# Patient Record
Sex: Female | Born: 2000 | Race: Black or African American | Hispanic: No | Marital: Single | Smoking: Never smoker
Health system: Southern US, Community
[De-identification: ages and names within clinical notes are randomized; demographics above are authoritative.]

## PROBLEM LIST (undated history)

## (undated) DIAGNOSIS — N1330 Unspecified hydronephrosis: Secondary | ICD-10-CM

## (undated) HISTORY — PX: OTHER SURGICAL HISTORY: SHX169

---
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
## 2019-12-26 ENCOUNTER — Encounter: Payer: Self-pay | Admitting: Family Medicine

## 2019-12-26 ENCOUNTER — Ambulatory Visit: Payer: BC Managed Care – PPO | Admitting: Family Medicine

## 2019-12-26 ENCOUNTER — Other Ambulatory Visit: Payer: Self-pay

## 2019-12-26 VITALS — BP 108/62 | HR 82 | Temp 97.5°F | Resp 12 | Ht 61.0 in | Wt 99.8 lb

## 2019-12-26 DIAGNOSIS — N185 Chronic kidney disease, stage 5: Secondary | ICD-10-CM | POA: Diagnosis not present

## 2019-12-26 DIAGNOSIS — N2581 Secondary hyperparathyroidism of renal origin: Secondary | ICD-10-CM | POA: Diagnosis not present

## 2019-12-26 DIAGNOSIS — D649 Anemia, unspecified: Secondary | ICD-10-CM | POA: Diagnosis not present

## 2019-12-26 DIAGNOSIS — Z7689 Persons encountering health services in other specified circumstances: Secondary | ICD-10-CM

## 2019-12-26 DIAGNOSIS — E559 Vitamin D deficiency, unspecified: Secondary | ICD-10-CM | POA: Diagnosis not present

## 2019-12-26 DIAGNOSIS — N1330 Unspecified hydronephrosis: Secondary | ICD-10-CM

## 2019-12-26 NOTE — Progress Notes (Signed)
Name: Ashley Garrison   MRN: [id number]    DOB: 02/05/2001   Date:12/26/2019       Progress Note  Chief Complaint  Patient presents with  . Establish Care     Subjective:   Ashley Garrison is a 19 y.o. female, presents to clinic to establish care.  She states she was seen here a long time ago, she is a poor historian some the details about her diagnoses and past medical care are vague for unclear at best.  When asking about her past doctor she says she was a prior patient here, [facility] and it looks like she needed to transition from pediatric to adult primary care.  When reviewing care everywhere and [facility] visits it appears that she has several specialist that she is seeing including urology and nephrology.  She states that she is no longer seeing them but does not really explain why and she is very unclear about her medical conditions, her past labs, and any follow-up plan.  I do believe after reviewing the chart at length and discussing it with the patient at length that she needed to transition from pediatric subspecialist to normal urology and nephrology but she perhaps does not understand this. She gives a history of bladder problems and some kidney problems.  In reviewing chart it appears that she has end-stage renal failure headed for dialysis or transplant when I have asked about these things she is very unsure or unclear by the history she states that they talked about possible kidney transplant but she does not seem to be aware that she still has severe kidney disease.  She is not on any medications, and has not been seen for at least 4 months by her pediatrician or her specialist likely secondary to turning 18 last August.  She states she is on oils and supplements from some doctors in [place] that sold them to her but she does not know what she is using or the clinic or practice name nor any of the providers names.  She has history of hydronephrosis,  bladder dysfunction and saw you pediatric urology, Dr. Gari Crown at [facility].  She currently denies any flank pain, abdominal pain, decreased urine output.  She states that she drinks a lot of water and urinates about 4 times a day.  She states that she has a distended bladder and that they had discussed possibly doing a indwelling catheter to decompress the bladder but she has never done any self-catheterization or indwelling catheter before.  She reports and per [facility] there is a history of of CKD stage 4, was seeing nephrology Dr. Noberto Retort at [facility], "not seeing them any more"- she states she was seeing them about every 6 months.  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  She does not know if she has had any kidney biopsies or what her diagnosis is causing her nephropathy.  She denies any decreased urine output, rashes or pruritus, peripheral edema, weight changes, shortness of breath, chest pain, palpitations, orthopnea, near syncope, fatigue. Last visit with [facility] Sept 2020 -stated he was concerned for renal failure headed towards dialysis or transplant Ultrasound shows dense scarred echogenic kidneys with hydronephrosis and follow-up bladder with trabeculation Patient states her grandmother has a history of kidney failure and she does not know  any other pertinent medical history Hx of anemia last Hgb was 9.4, with iron deficiency, not on any iron supplements and she has not seen hematology Has regular menses, normal cycle, periods lasts 3d, no heavy bleeding  Went to pediatric [facility] before - will request records - [place] She states she came to this same clinic a long time ago   Patient Active Problem List   Diagnosis Date Noted  . 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 Hyperparathyroidism,  secondary renal ([place]) 12/27/2019  . Vitamin D deficiency disease 12/27/2019  . Anemia 12/27/2019  . Hydronephrosis 12/27/2019    History reviewed. No pertinent surgical history.  History reviewed. No pertinent family history.  Social History   Socioeconomic History  . Marital status: Single    Spouse name: Not on file  . Number of children: Not on file  . Years of education: Not on file  . Highest education level: Not on file  Occupational History    Comment: [employer]  Tobacco Use  . Smoking status: Never Smoker  . Smokeless tobacco: Never Used  Substance and Sexual Activity  . Alcohol use: No  . Drug use: Never  . Sexual activity: Not Currently  Other Topics Concern  . Not on file  Social History Narrative  . Not on file   Social Determinants of Health   Financial Resource Strain:   . Difficulty of Paying Living Expenses: Not on file  Food Insecurity:   . Worried About Charity fundraiser in the Last Year: Not on file  . Ran Out of Food in the Last Year: Not on file  Transportation Needs:   . Lack of Transportation (Medical): Not on file  . Lack of Transportation (Non-Medical): Not on file  Physical Activity:   . Days of Exercise per Week: Not on file  . Minutes of Exercise per Session: Not on file  Stress:   . Feeling of Stress : Not on file  Social Connections:   . Frequency of Communication with Friends and Family: Not on file  . Frequency of Social Gatherings with Friends and Family: Not on file  . Attends Religious Services: Not on file  . Active Member of Clubs or Organizations: Not on file  . Attends Archivist Meetings: Not on file  . Marital Status: Not on file  Intimate Partner Violence:   . Fear of Current or Ex-Partner: Not on file  . Emotionally Abused: Not on file  . Physically Abused: Not on file  . Sexually Abused: Not on file    No current outpatient medications on file.  Allergies  Allergen Reactions  . Other     fruit     Chart Review Today: Extensive chart review through care everywhere [facility] visits, labs I personally reviewed active problem list, medication list, allergies, family history, social history, health maintenance, notes from last encounter, lab results, imaging with the patient/caregiver today.   Review of Systems  Constitutional: Negative.   HENT: Negative.   Eyes: Negative.   Respiratory: Negative.   Cardiovascular: Negative.   Gastrointestinal: Negative.   Endocrine: Negative.   Genitourinary: Negative.   Musculoskeletal: Negative.   Skin: Negative.   Allergic/Immunologic: Negative.   Neurological: Negative.   Hematological: Negative.   Psychiatric/Behavioral: Negative.   All other systems reviewed and are negative.    Objective:    Vitals:   12/26/19 1515  BP: 108/62  Pulse: 82  Resp: 12  Temp: (!) 97.5 F (36.4 C)  TempSrc: Temporal  SpO2: 100%  Weight: 99 lb 12.8 oz (45.3 kg)  Height: 5\' 1"  (1.549 m)    Body mass index is 18.86 kg/m.  Physical Exam Vitals and nursing note reviewed.  Constitutional:      General: She is not in acute distress.    Appearance: Normal appearance. She is well-developed. She is not ill-appearing, toxic-appearing or diaphoretic.     Interventions: Face mask in place.     Comments: Thin well appearing female  HENT:     Head: Normocephalic and atraumatic.     Right Ear: External ear normal.     Left Ear: External ear normal.  Eyes:     General: Lids are normal. No scleral icterus.       Right eye: No discharge.        Left eye: No discharge.     Conjunctiva/sclera: Conjunctivae normal.  Neck:     Trachea: Phonation normal. No tracheal deviation.  Cardiovascular:     Rate and Rhythm: Normal rate and regular rhythm.     Pulses: Normal pulses.          Radial pulses are 2+ on the right side and 2+ on the left side.       Posterior tibial pulses are 2+ on the right side and 2+ on the left side.     Heart sounds: Normal heart  sounds. No murmur. No friction rub. No gallop.   Pulmonary:     Effort: Pulmonary effort is normal. No respiratory distress.     Breath sounds: Normal breath sounds. No stridor. No wheezing, rhonchi or rales.  Chest:     Chest wall: No tenderness.  Abdominal:     General: Bowel sounds are normal. There is no distension.     Palpations: Abdomen is soft.     Tenderness: There is no abdominal tenderness. There is no guarding or rebound.  Musculoskeletal:        General: No deformity. Normal range of motion.     Cervical back: Normal range of motion and neck supple.     Right lower leg: No edema.     Left lower leg: No edema.  Lymphadenopathy:     Cervical: No cervical adenopathy.  Skin:    General: Skin is warm and dry.     Capillary Refill: Capillary refill takes less than 2 seconds.     Coloration: Skin is not jaundiced or pale.     Findings: No rash.  Neurological:     Mental Status: She is alert and oriented to person, place, and time.     Motor: No abnormal muscle tone.     Gait: Gait normal.  Psychiatric:        Speech: Speech normal.        Behavior: Behavior normal.      PHQ2/9: Depression screen PHQ 2/9 12/26/2019  Decreased Interest 0  Down, Depressed, Hopeless 0  PHQ - 2 Score 0  Altered sleeping 0  Tired, decreased energy 0  Change in appetite 0  Feeling bad or failure about yourself  0  Trouble concentrating 0  Moving slowly or fidgety/restless 0  Suicidal thoughts 0  PHQ-9 Score 0  Difficult doing work/chores Not difficult at all    phq 9 is negative, reviewed  Fall Risk: Fall Risk  12/26/2019  Falls in the past year? 0  Number falls in past yr: 0  Injury with Fall? 0  Functional Status Survey: Is the patient deaf or have difficulty hearing?: No Does the patient have difficulty seeing, even when wearing glasses/contacts?: No Does the patient have difficulty concentrating, remembering, or making decisions?: No Does the patient have difficulty  walking or climbing stairs?: No Does the patient have difficulty dressing or bathing?: No Does the patient have difficulty doing errands alone such as visiting a doctor's office or shopping?: No   Assessment & Plan:     ICD-10-CM   1. Stage 5 chronic kidney disease not on chronic dialysis ([place])  N18.5 CMP w GFR    Uric acid    Vit D    CBC +diff and smear    Iron, TIBC and Ferritin Panel    PTH    TSH    T4, Free    Ambulatory referral to Nephrology   last GFR 9, previously saw pediatric nephrology at [facility], likely aged out, pt is poor historian, seems unaware of renal function/chronic disease - refer nephro  2. Hyperparathyroidism, secondary renal ([place])  N25.81 CMP w GFR    Uric acid    Vit D    PTH    TSH    T4, Free    Ambulatory referral to Nephrology   recheck labs, refer  3. Vitamin D deficiency disease  E55.9 Vit D    Ambulatory referral to Nephrology   recheck - will need nephro  4. Anemia, unspecified type  D64.9 CBC +diff and smear    Iron, TIBC and Ferritin Panel    Ambulatory referral to Nephrology   recheck, no concerning blood loss with likely some continued anemia or chronic disease  5. Hydronephrosis, unspecified hydronephrosis type  N13.30 Ambulatory referral to Urology   some kind of bladder dysfunction and hydronephrosis, she is urinating normally and has no abd pain or flank pain, est with urology  6. Encounter to establish care with new doctor  Z76.89    extensive chart review, requested records from past pediatrician    concern for renal failure? W/o any current management - refer and labs today  She is not oliguric currently per her report, but poor historian, unclear if she understands her dx/med conditions.  Return for 3 month - f/up on chronic conditions and specialist visits.   Delsa Grana, PA-C 12/26/19 3:31 PM

## 2019-12-27 ENCOUNTER — Ambulatory Visit: Payer: Self-pay | Admitting: Family Medicine

## 2019-12-27 DIAGNOSIS — N2581 Secondary hyperparathyroidism of renal origin: Secondary | ICD-10-CM | POA: Insufficient documentation

## 2019-12-27 DIAGNOSIS — N185 Chronic kidney disease, stage 5: Secondary | ICD-10-CM | POA: Insufficient documentation

## 2019-12-27 DIAGNOSIS — Z992 Dependence on renal dialysis: Secondary | ICD-10-CM | POA: Insufficient documentation

## 2019-12-27 DIAGNOSIS — D649 Anemia, unspecified: Secondary | ICD-10-CM | POA: Insufficient documentation

## 2019-12-27 DIAGNOSIS — E559 Vitamin D deficiency, unspecified: Secondary | ICD-10-CM | POA: Insufficient documentation

## 2019-12-27 DIAGNOSIS — N186 End stage renal disease: Secondary | ICD-10-CM | POA: Insufficient documentation

## 2019-12-27 DIAGNOSIS — N1330 Unspecified hydronephrosis: Secondary | ICD-10-CM | POA: Insufficient documentation

## 2019-12-27 HISTORY — DX: Anemia, unspecified: D64.9

## 2019-12-27 LAB — IRON,TIBC AND FERRITIN PANEL
%SAT: 23 % (calc) (ref 15–45)
Ferritin: 76 ng/mL (ref 16–154)
Iron: 59 ug/dL (ref 27–164)
TIBC: 258 mcg/dL (calc) — ABNORMAL LOW (ref 271–448)

## 2019-12-27 LAB — COMPLETE METABOLIC PANEL WITH GFR
AG Ratio: 1.1 (calc) (ref 1.0–2.5)
ALT: 5 U/L (ref 5–32)
AST: 11 U/L — ABNORMAL LOW (ref 12–32)
Albumin: 4.1 g/dL (ref 3.6–5.1)
Alkaline phosphatase (APISO): 146 U/L — ABNORMAL HIGH (ref 36–128)
BUN/Creatinine Ratio: 5 (calc) — ABNORMAL LOW (ref 6–22)
BUN: 36 mg/dL — ABNORMAL HIGH (ref 7–20)
CO2: 20 mmol/L (ref 20–32)
Calcium: 8.7 mg/dL — ABNORMAL LOW (ref 8.9–10.4)
Chloride: 108 mmol/L (ref 98–110)
Creat: 6.6 mg/dL — ABNORMAL HIGH (ref 0.50–1.00)
GFR, Est African American: 10 mL/min/{1.73_m2} — ABNORMAL LOW (ref >=60)
GFR, Est Non African American: 8 mL/min/{1.73_m2} — ABNORMAL LOW (ref >=60)
Globulin: 3.9 g/dL (calc) — ABNORMAL HIGH (ref 2.0–3.8)
Glucose, Bld: 86 mg/dL (ref 65–99)
Potassium: 4.8 mmol/L (ref 3.8–5.1)
Sodium: 137 mmol/L (ref 135–146)
Total Bilirubin: 0.4 mg/dL (ref 0.2–1.1)
Total Protein: 8 g/dL (ref 6.3–8.2)

## 2019-12-27 LAB — CBC (INCLUDES DIFF/PLT) WITH PATHOLOGIST REVIEW
Absolute Monocytes: 498 cells/uL (ref 200–950)
Basophils Absolute: 17 cells/uL (ref 0–200)
Basophils Relative: 0.2 %
Eosinophils Absolute: 100 cells/uL (ref 15–500)
Eosinophils Relative: 1.2 %
HCT: 28.8 % — ABNORMAL LOW (ref 35.0–45.0)
Hemoglobin: 9.4 g/dL — ABNORMAL LOW (ref 11.7–15.5)
Lymphs Abs: 3171 cells/uL (ref 850–3900)
MCH: 27.4 pg (ref 27.0–33.0)
MCHC: 32.6 g/dL (ref 32.0–36.0)
MCV: 84 fL (ref 80.0–100.0)
MPV: 10.5 fL (ref 7.5–12.5)
Monocytes Relative: 6 %
Neutro Abs: 4515 cells/uL (ref 1500–7800)
Neutrophils Relative %: 54.4 %
Platelets: 284 10*3/uL (ref 140–400)
RBC: 3.43 10*6/uL — ABNORMAL LOW (ref 3.80–5.10)
RDW: 15.8 % — ABNORMAL HIGH (ref 11.0–15.0)
Total Lymphocyte: 38.2 %
WBC: 8.3 10*3/uL (ref 3.8–10.8)

## 2019-12-27 LAB — PARATHYROID HORMONE, INTACT (NO CA): PTH: 1004 pg/mL — ABNORMAL HIGH (ref 14–64)

## 2019-12-27 LAB — URIC ACID: Uric Acid, Serum: 7.9 mg/dL — ABNORMAL HIGH (ref 2.5–7.0)

## 2019-12-27 LAB — VITAMIN D 25 HYDROXY (VIT D DEFICIENCY, FRACTURES): Vit D, 25-Hydroxy: 10 ng/mL — ABNORMAL LOW (ref 30–100)

## 2019-12-31 DIAGNOSIS — D631 Anemia in chronic kidney disease: Secondary | ICD-10-CM | POA: Insufficient documentation

## 2019-12-31 DIAGNOSIS — E213 Hyperparathyroidism, unspecified: Secondary | ICD-10-CM | POA: Insufficient documentation

## 2019-12-31 DIAGNOSIS — N185 Chronic kidney disease, stage 5: Secondary | ICD-10-CM | POA: Insufficient documentation

## 2020-01-02 ENCOUNTER — Ambulatory Visit: Payer: Self-pay | Admitting: Family Medicine

## 2020-01-02 NOTE — Telephone Encounter (Signed)
Three attempts to reach pt, unable to leave VM.  Per agent:   Summary: Stubbed toe   Pt's mother stated pt stubbed her toe last night on her bed. There is a sharp pain that runs under the bottom of her foot. She would like to speak with NT about anything she could do at home. No appts available with PCP. Advised to seek UC.

## 2020-01-02 NOTE — Telephone Encounter (Signed)
Reached out spoke with mother and daughter, they did go to urgent care and she did break toe and they put her in a boot.

## 2020-01-22 ENCOUNTER — Ambulatory Visit (INDEPENDENT_AMBULATORY_CARE_PROVIDER_SITE_OTHER): Payer: BC Managed Care – PPO | Admitting: Urology

## 2020-01-22 ENCOUNTER — Other Ambulatory Visit
Admission: RE | Admit: 2020-01-22 | Discharge: 2020-01-22 | Disposition: A | Discharge status: Home / Self Care | Payer: BC Managed Care – PPO | Attending: Urology | Admitting: Urology

## 2020-01-22 ENCOUNTER — Other Ambulatory Visit: Payer: Self-pay

## 2020-01-22 ENCOUNTER — Encounter: Payer: Self-pay | Admitting: Urology

## 2020-01-22 VITALS — BP 134/84 | HR 81 | Ht 61.0 in | Wt 95.0 lb

## 2020-01-22 DIAGNOSIS — N398 Other specified disorders of urinary system: Secondary | ICD-10-CM

## 2020-01-22 NOTE — Progress Notes (Signed)
   01/22/20 10:16 AM   Marlou Sa Ramthun 20-Dec-2000 [id number]  Referring provider: Delsa Grana, PA-C [address] [place],  [place] [address]  CC: Renal failure and bladder dysfunction  HPI: I saw Ashley Garrison and her mother in urology clinic for evaluation of renal failure and bladder dysfunction.  She was previously followed by [facility] pediatric urology.  The history is obtained entirely from her mother.  It sounds like they are looking for a second opinion.  Briefly, she is a complex 19 year old female with renal failure and bilateral hydroureteronephrosis with recent eGFR of 10 that has been followed long term by [facility] pediatric urology for bladder dysfunction.  She was last seen in September 2020 by Dr. 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  She apparently was managed with pelvic floor physical therapy with minimal improvement, and she was unable to perform intermittent catheterization.  His plan at that time was ongoing physical therapy and repeat urodynamics to see if her bladder had responded to physical therapy with the understanding that she is likely headed for renal transplant and would need a functional bladder or urinary diversion.  Interestingly, the patient denies any urinary complaints including difficulty urinating or urinary leakage.  She may have 1 UTI per year, but has not had a UTI for over 18 months.  I explained her situation at length to her mother and the etiology of her renal failure.  I drew multiple pictures explaining her high pressure bladder and bladder dysfunction causing her renal failure, and the need for solution prior to kidney transplant.  I also explained that urinary diversion is outside the scope of my practice as a community urologist, and I recommended re-establishing with her urologist at [facility].  They would like to seek  a second opinion at [facility], and I have placed a referral to Dr. Rolanda Lundborg who is the reconstructive urologist there.  Billey Co, [facility] [address] [place], [place] [address] [phone]

## 2020-01-31 ENCOUNTER — Telehealth: Payer: Self-pay | Admitting: Family Medicine

## 2020-01-31 NOTE — Telephone Encounter (Signed)
Copied from [place] [phone]. Topic: General - Other >> Jan 31, 2020  3:26 PM Alanda Slim E wrote: Reason for CRM: courtesy notification that the Pt has enrolled in on going support with [employer] for renal disease  If you need more information please Call Mathews Argyle from [facility] @ [phone] for

## 2020-02-26 DIAGNOSIS — N3289 Other specified disorders of bladder: Secondary | ICD-10-CM | POA: Insufficient documentation

## 2020-03-25 ENCOUNTER — Encounter: Payer: Self-pay | Admitting: Family Medicine

## 2020-03-25 ENCOUNTER — Ambulatory Visit: Payer: BC Managed Care – PPO | Admitting: Family Medicine

## 2020-03-25 ENCOUNTER — Other Ambulatory Visit: Payer: Self-pay

## 2020-03-25 VITALS — BP 102/70 | HR 98 | Temp 98.7°F | Resp 14 | Ht 61.0 in | Wt 101.8 lb

## 2020-03-25 DIAGNOSIS — N2581 Secondary hyperparathyroidism of renal origin: Secondary | ICD-10-CM

## 2020-03-25 DIAGNOSIS — D631 Anemia in chronic kidney disease: Secondary | ICD-10-CM | POA: Diagnosis not present

## 2020-03-25 DIAGNOSIS — N185 Chronic kidney disease, stage 5: Secondary | ICD-10-CM | POA: Diagnosis not present

## 2020-03-25 NOTE — Progress Notes (Signed)
Name: Ashley Garrison   MRN: [id number]    DOB: Jan 23, 2001   Date:03/25/2020       Progress Note  Chief Complaint  Patient presents with  . Follow-up  . Anemia  . Chronic Kidney Disease     Subjective:   Ashley Garrison is a 19 y.o. female, presents to clinic for routine follow up on the conditions listed above.  Patient presented as a new patient to establish care last year and was lost to follow-up with multiple specialist for chronic kidney disease stage V with worsening labs and through reviewing care everywhere patient needed assessment with urology, nephrology and surgery and possibly consultation with transplant surgery team.  She has a family history of renal disease.  She has anemia hyperparathyroidism and also hydronephrosis and urinary retention, vitamin D deficiency.  She was referred out to specialist and she has been able to establish care with all specialist who have gotten her set up for peritoneal dialysis, she is still making small amounts of urine she is not having any urinary symptoms.  Her anemia has been stable and she does not feel fatigued denies any tachycardia, shortness of breath, pallor, exertional symptoms.  Chronic kidney disease, Stage V 12/31/2019  Anemia in chronic kidney disease 12/31/2019  Hyperparathyroidism 12/31/2019  Hydronephrosis 12/31/2019  Vitamin D deficiency    Has est with nephrology, general surgery and urology since est care here   Anemia of chronic kidney disease - 9.6, up from 9.4 8.6 last year in [place] and aug  Urology - [place]  Dr. Raul Del is her general surgeon for peritoneal dialysis- she has f/up soon, has not yet started dialysis Nephrology - est with Dr. Holley Raring    Patient Active Problem List   Diagnosis Date Noted  . Anemia in chronic kidney disease 12/31/2019  . Hyperparathyroidism ([place]) 12/31/2019  . 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 Hyperparathyroidism, secondary renal ([place])  12/27/2019  . Vitamin D deficiency disease 12/27/2019  . Anemia 12/27/2019  . Hydronephrosis 12/27/2019  . History of anemia due to chronic kidney disease 08/18/2019  . Creatinine elevation 03/11/2016  . Syncope 03/11/2016  . Lower urinary tract infectious disease 11/22/2013  . Constipation 11/19/2013  . SIRS due to Gram-negative infection 11/19/2013  . Acute kidney injury ([place]) 11/17/2013  . Pyelonephritis 11/17/2013  . 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 pd cath      History reviewed. No pertinent family history.  Social History   Tobacco Use  . Smoking status: Never Smoker  . Smokeless tobacco: Never Used  Substance Use Topics  . Alcohol use: No  . Drug use: Never      Current Outpatient Medications:  .  calcitRIOL (ROCALTROL) 0.5 MCG capsule, Take 0.5 mcg by mouth daily., Disp: , Rfl:   Allergies  Allergen Reactions  . Other     fruit Other reaction(s): Other (See Comments) Angioedema; undiagnosed as to what.    Chart Review Today: I personally reviewed active problem list, medication list, allergies, family history, social history, health maintenance, notes from last encounter, lab results, imaging with the patient/caregiver today. Chart review of multiple specialist evaluations procedures and lab work today  Review of Systems  10 Systems reviewed and are negative for acute change except as noted in the HPI.  Objective:    Vitals:   03/25/20 1529  BP: 102/70  Pulse: 98  Resp: 14  Temp: 98.7 F (  37.1 C)  SpO2: 100%  Weight: 101 lb 12.8 oz (46.2 kg)  Height: 5\' 1"  (1.549 m)    Body mass index is 19.23 kg/m.  Physical Exam Vitals and nursing note reviewed.  Constitutional:      General: She is not in acute distress.    Appearance: Normal appearance. She is well-developed. She is not ill-appearing, toxic-appearing or diaphoretic.     Comments: Well-appearing young thin female no acute distress    HENT:     Head: Normocephalic and atraumatic.     Nose: Nose normal.  Eyes:     General:        Right eye: No discharge.        Left eye: No discharge.     Conjunctiva/sclera: Conjunctivae normal.  Neck:     Trachea: No tracheal deviation.  Cardiovascular:     Rate and Rhythm: Normal rate and regular rhythm.     Pulses: Normal pulses.  Pulmonary:     Effort: Pulmonary effort is normal. No respiratory distress.     Breath sounds: Normal breath sounds. No stridor.  Musculoskeletal:        General: Normal range of motion.  Skin:    General: Skin is warm and dry.     Findings: No rash.  Neurological:     Mental Status: She is alert.     Motor: No abnormal muscle tone.     Coordination: Coordination normal.  Psychiatric:        Mood and Affect: Mood normal.        Behavior: Behavior normal.      PHQ2/9: Depression screen [facility] 2/9 03/25/2020 12/26/2019  Decreased Interest 0 0  Down, Depressed, Hopeless 0 0  PHQ - 2 Score 0 0  Altered sleeping 0 0  Tired, decreased energy 0 0  Change in appetite 0 0  Feeling bad or failure about yourself  0 0  Trouble concentrating 0 0  Moving slowly or fidgety/restless 0 0  Suicidal thoughts 0 0  PHQ-9 Score 0 0  Difficult doing work/chores Not difficult at all Not difficult at all    phq 9 is negative, reviewed today  Fall Risk: Fall Risk  03/25/2020 12/26/2019  Falls in the past year? 0 0  Number falls in past yr: 0 0  Injury with Fall? 0 0    Functional Status Survey: Is the patient deaf or have difficulty hearing?: No Does the patient have difficulty seeing, even when wearing glasses/contacts?: No Does the patient have difficulty concentrating, remembering, or making decisions?: No Does the patient have difficulty walking or climbing stairs?: No Does the patient have difficulty dressing or bathing?: No Does the patient have difficulty doing errands alone such as visiting a doctor's office or shopping?: No   Assessment &  Plan:   patient is 19 year old female who presents for follow-up today Thankfully she has established with all of her required specialist and she is currently getting set up for peritoneal dialysis for stage V chronic kidney disease she is seeing nephrology, urology and general surgery.  She is currently asymptomatic and has no other conditions requiring lab work or further evaluation or assessment today.  Did review her chart extensively.  Patient will return for a complete physical  Patient's conditions listed below are managed by nephrology, Dr. Zollie Scale who she has follow-up with.   ICD-10-CM   1. Stage 5 chronic kidney disease not on chronic dialysis ([place])  N18.5   2. Hyperparathyroidism, secondary renal ([place])  N25.81   3. Anemia in stage 5 chronic kidney disease, not on chronic dialysis ([place])  N18.5    D63.1      No follow-ups on file.   Delsa Grana, PA-C 03/25/20 3:53 PM

## 2020-04-01 ENCOUNTER — Encounter: Payer: Self-pay | Admitting: Family Medicine

## 2020-04-30 DIAGNOSIS — N398 Other specified disorders of urinary system: Secondary | ICD-10-CM | POA: Insufficient documentation

## 2020-10-24 ENCOUNTER — Ambulatory Visit (INDEPENDENT_AMBULATORY_CARE_PROVIDER_SITE_OTHER): Payer: BC Managed Care – PPO | Admitting: Family Medicine

## 2020-10-24 ENCOUNTER — Other Ambulatory Visit: Payer: Self-pay

## 2020-10-24 ENCOUNTER — Encounter: Payer: Self-pay | Admitting: Family Medicine

## 2020-10-24 VITALS — BP 104/68 | HR 89 | Temp 98.1°F | Resp 18 | Ht 61.0 in | Wt 98.8 lb

## 2020-10-24 DIAGNOSIS — N390 Urinary tract infection, site not specified: Secondary | ICD-10-CM

## 2020-10-24 DIAGNOSIS — N2581 Secondary hyperparathyroidism of renal origin: Secondary | ICD-10-CM

## 2020-10-24 DIAGNOSIS — Z992 Dependence on renal dialysis: Secondary | ICD-10-CM

## 2020-10-24 DIAGNOSIS — N1330 Unspecified hydronephrosis: Secondary | ICD-10-CM | POA: Diagnosis not present

## 2020-10-24 DIAGNOSIS — N398 Other specified disorders of urinary system: Secondary | ICD-10-CM

## 2020-10-24 DIAGNOSIS — E559 Vitamin D deficiency, unspecified: Secondary | ICD-10-CM | POA: Diagnosis not present

## 2020-10-24 DIAGNOSIS — N186 End stage renal disease: Secondary | ICD-10-CM | POA: Diagnosis not present

## 2020-10-24 DIAGNOSIS — Z0000 Encounter for general adult medical examination without abnormal findings: Secondary | ICD-10-CM

## 2020-10-24 DIAGNOSIS — D631 Anemia in chronic kidney disease: Secondary | ICD-10-CM

## 2020-10-24 DIAGNOSIS — Z7185 Encounter for immunization safety counseling: Secondary | ICD-10-CM

## 2020-10-24 NOTE — Patient Instructions (Signed)
Preventive Care 18-21 Years Old, Female Preventive care refers to lifestyle choices and visits with your health care provider that can promote health and wellness. At this stage in your life, you may start seeing a primary care physician instead of a pediatrician. Your health care is now your responsibility. Preventive care for young adults includes:  A yearly physical exam. This is also called an annual wellness visit.  Regular dental and eye exams.  Immunizations.  Screening for certain conditions.  Healthy lifestyle choices, such as diet and exercise. What can I expect for my preventive care visit? Physical exam Your health care provider may check:  Height and weight. These may be used to calculate body mass index (BMI), which is a measurement that tells if you are at a healthy weight.  Heart rate and blood pressure.  Body temperature. Counseling Your health care provider may ask you questions about:  Past medical problems and family medical history.  Alcohol, tobacco, and drug use.  Home and relationship well-being.  Access to firearms.  Emotional well-being.  Diet, exercise, and sleep habits.  Sexual activity and sexual health.  Method of birth control.  Menstrual cycle.  Pregnancy history. What immunizations do I need?  Influenza (flu) vaccine  This is recommended every year. Tetanus, diphtheria, and pertussis (Tdap) vaccine  You may need a Td booster every 10 years. Varicella (chickenpox) vaccine  You may need this vaccine if you have not already been vaccinated. Human papillomavirus (HPV) vaccine  If recommended by your health care provider, you may need three doses over 6 months. Measles, mumps, and rubella (MMR) vaccine  You may need at least one dose of MMR. You may also need a second dose. Meningococcal conjugate (MenACWY) vaccine  One dose is recommended if you are 19-21 years old and a first-year college student living in a residence hall,  or if you have one of several medical conditions. You may also need additional booster doses. Pneumococcal conjugate (PCV13) vaccine  You may need this if you have certain conditions and were not previously vaccinated. Pneumococcal polysaccharide (PPSV23) vaccine  You may need one or two doses if you smoke cigarettes or if you have certain conditions. Hepatitis A vaccine  You may need this if you have certain conditions or if you travel or work in places where you may be exposed to hepatitis A. Hepatitis B vaccine  You may need this if you have certain conditions or if you travel or work in places where you may be exposed to hepatitis B. Haemophilus influenzae type b (Hib) vaccine  You may need this if you have certain risk factors. You may receive vaccines as individual doses or as more than one vaccine together in one shot (combination vaccines). Talk with your health care provider about the risks and benefits of combination vaccines. What tests do I need? Blood tests  Lipid and cholesterol levels. These may be checked every 5 years starting at age 20.  Hepatitis C test.  Hepatitis B test. Screening  Pelvic exam and Pap test. This may be done every 3 years starting at age 21.  Sexually transmitted disease (STD) testing, if you are at risk.  BRCA-related cancer screening. This may be done if you have a family history of breast, ovarian, tubal, or peritoneal cancers. Other tests  Tuberculosis skin test.  Vision and hearing tests.  Skin exam.  Breast exam. Follow these instructions at home: Eating and drinking   Eat a diet that includes fresh fruits and   vegetables, whole grains, lean protein, and low-fat dairy products.  Drink enough fluid to keep your urine pale yellow.  Do not drink alcohol if: ? Your health care provider tells you not to drink. ? You are pregnant, may be pregnant, or are planning to become pregnant. ? You are under the legal drinking age. In the  [place] the legal drinking age is 21.  If you drink alcohol: ? Limit how much you have to 0-1 drink a day. ? Be aware of how much alcohol is in your drink. In the [place] one drink equals one 12 oz bottle of beer (355 mL), one 5 oz glass of wine (148 mL), or one 1 oz glass of hard liquor (44 mL). Lifestyle  Take daily care of your teeth and gums.  Stay active. Exercise at least 30 minutes 5 or more days of the week.  Do not use any products that contain nicotine or tobacco, such as cigarettes, e-cigarettes, and chewing tobacco. If you need help quitting, ask your health care provider.  Do not use drugs.  If you are sexually active, practice safe sex. Use a condom or other form of birth control (contraception) in order to prevent pregnancy and STIs (sexually transmitted infections). If you plan to become pregnant, see your health care provider for a pre-conception visit.  Find healthy ways to cope with stress, such as: ? Meditation, yoga, or listening to music. ? Journaling. ? Talking to a trusted person. ? Spending time with friends and family. Safety  Always wear your seat belt while driving or riding in a vehicle.  Do not drive if you have been drinking alcohol. Do not ride with someone who has been drinking.  Do not drive when you are tired or distracted. Do not text while driving.  Wear a helmet and other protective equipment during sports activities.  If you have firearms in your house, make sure you follow all gun safety procedures.  Seek help if you have been bullied, physically abused, or sexually abused.  Use the Internet responsibly to avoid dangers such as online bullying and online sex predators. What's next?  Go to your health care provider once a year for a well check visit.  Ask your health care provider how often you should have your eyes and teeth checked.  Stay up to date on all vaccines. This information is not intended to replace advice given to you by  your health care provider. Make sure you discuss any questions you have with your health care provider. Document Revised: 11/23/2018 Document Reviewed: 11/23/2018 Elsevier Patient Education  2020 Elsevier Inc.  

## 2020-10-24 NOTE — Progress Notes (Signed)
Adolescent/Young adult- Well Care Visit Ashley Garrison is a 19 y.o. female who is here for well care.    PCP:  Delsa Grana, PA-C  Ashley Garrison is a 19 y.o. female, presents for CPE today  Hx of ESRD, has recently had PD catheter placement, has been working with nephrology and urology  Seeing Dr. Holley Raring -  Has secondary hyperparathyroid, vit d deficiency, anemia  Hydronephrosis and urinary retention On cinacalcet 30 mg once daily and stopped calcitriol 1 mcg- per nephrologist Recent labs done - reviewed per care everywhere She is working with [facility] transplant team   15 Oct 2020  Iron and Iron binding capacity panel - Serum or Plasma      15 Oct 2020  Calcium-phosphorus product [(Mass/volume)*2] in Serum or Plasma  26.3 Calc   21 - 53   15 Oct 2020  Hematocrit:VFr:Pt:Bld:Qn:Automated count:  39.5 %   37.0 - 47.0   15 Oct 2020  Albumin/Globulin [Mass ratio] in Serum or Plasma  1.5 Calc   1 - 2.5   15 Oct 2020  [facility] PD W/CL      15 Oct 2020  Lymphocytes [#/volume] in Blood by Manual count  3456.90 Cell/uL   1100 - 4800   15 Oct 4096  Basic metabolic 3532 panel with ionized calcium:-:Pt:Ser/Plas:Qn::      15 Oct 2020  Bicarbonate:SCnc:Pt:Ser:Qn::  27 mEq/L   20 - 31   15 Oct 2020  Chloride:SCnc:Pt:Ser/Plas:Qn::  105 mEq/L   99 - 109   15 Oct 2020  Monocytes/100 leukocytes:NFr:Pt:Bld:Qn:Automated count:  5.7 %     15 Oct 2020  Glucose [Mass/volume] in Serum or Plasma  81 mg/dL   70 - 99   15 Oct 2020  CAxPHOS CORRECTED  26.3 Calc   21 - 53   15 Oct 2020  Calcium [Mass/volume] in Serum or Plasma  9.4 mg/dL   8.7 - 10.4   15 Oct 2020  Leukocytes:NCnc:Pt:Bld:Qn:Automated count:  8.4 x 10'3 cells/uL   4.5 - 11.0   15 Oct 2020  Creatinine [Mass/volume] in Serum or Plasma  5.14 mg/dL   0.50 - 1.10   15 Oct 2020  Monocytes [#/volume] in Blood by Manual count  475.95 Cell/uL   0 - 1100   15 Oct 2020  Basophils/100 leukocytes:NFr:Pt:Bld:Qn:Automated count:  1.1 %     15 Oct 2020  Protein [Mass/volume] in Serum or Plasma  6.6 g/dL   5.7 - 8.2   15 Oct 2020  Eosinophils:NCnc:Pt:Bld:Qn:Manual count:  116.90 Cell/uL   0 - 700   15 Oct 2020  Erythrocyte mean corpuscular hemoglobin:EntMass:Pt:RBC:Qn:Automated count:  30.3 pg   27.0 - 31.0   15 Oct 2020  Basophils [#/volume] in Blood by Manual count  91.85 Cell/uL   0 - 400   15 Oct 2020  Erythrocytes:NCnc:Pt:Bld:Qn:Automated count:  4.33 x 10'6 cells/uL   4.20 - 5.40   15 Oct 2020  Urea nitrogen [Mass/volume] in Serum or Plasma  21 mg/dL   9 - 23   15 Oct 2020  Globulin [Mass/volume] in Serum  2.6 g/dL   0.9 - 5   15 Oct 2020  Erythrocyte distribution width:Ratio:Pt:RBC:Qn:Automated count:  12.6 %   11.0 - 15.0   15 Oct 2020  Erythrocyte mean corpuscular hemoglobin concentration:MCnc:Pt:RBC:Qn:Automated count:  33.1 g/dL   32.0 - 36.0   15 Oct 2020  Albumin:MCnc:Pt:Ser/Plas:Qn:BCG:  4.0 g/dL   3.2 - 4.8   15 Oct 2020  Eosinophils/100 leukocytes:NFr:Pt:Bld:Qn:Automated count:  1.4 %  15 Oct 2020  Ferritin [Mass/volume] in Serum or Plasma  39 ng/mL   10 - 291   15 Oct 2020  Iron [Mass/volume] in Serum or Plasma  94 ug/dL   50 - 170   15 Oct 2020  CBC W Auto Differential panel - Blood      15 Oct 2020  Iron saturation [Mass Fraction] in Serum or Plasma  31 %   16 - 46   15 Oct 2020  Neutrophils [#/volume] in Blood by Manual count  4208.40 Cell/uL   2000 - 8800   15 Oct 2020  Magnesium:MCnc:Pt:Ser/Plas:Qn::  2.3 mg/dL   1.3 - 2.7   15 Oct 2020  Hepatitis B virus surface Ag:PrThr:Pt:Ser/Plas:Ord:IA:  NEG   NEG   15 Oct 2020  Erythrocyte mean corpuscular hemoglobin concentration:MCnc:Pt:RBC:Qn:Automated count:  91.3 fL   80.0 - 100.0   15 Oct 2020  Platelets:NCnc:Pt:Bld:Qn:Automated count:  231 x 10'3 cells/uL   150 - 400   15 Oct 2020  Calcium [Moles/volume] corrected for total protein in Blood  9.4 MG/DL   8.7 - 10.4   15 Oct 2020  Hematocrit [Volume Fraction] of Blood by Estimated  39.3 %   37.0  - 47.0   15 Oct 2020  Iron binding capacity [Mass/volume] in Serum or Plasma  303 ug/dL   250 - 425   15 Oct 2020  Urea nitrogen/Creatinine [Mass ratio] in Blood  4.1 Calc   8.2 - 46   15 Oct 2020  Iron binding capacity.unsaturated [Mass/volume] in Serum or Plasma  209 ug/dL   80 - 375   15 Oct 2020  Potassium:SCnc:Pt:Ser/Plas:Qn::  4.0 mEq/L   3.5 - 5.5   15 Oct 2020  Lymphocytes/100 leukocytes:NFr:Pt:Bld:Qn:Automated count:  41.4 %     15 Oct 2020  Hemoglobin [Mass/volume] in Blood  13.1 g/dL   12.0 - 16.0   15 Oct 2020  Phosphate:MSCnc:Pt:Ser/Plas:Qn::  2.8 mg/dL   2.4 - 5.1   15 Oct 2020  Neutrophils/100 leukocytes:NFr:Pt:Bld:Qn:Automated count:  50.4 %     15 Oct 2020  Alanine aminotransferase [Enzymatic activity/volume] in Serum or Plasma  10 U/L   10 - 49   15 Oct 2020  Sodium [Moles/volume] in Serum or Plasma  139 mEq/L   132 - 146       Current Issues: Current concerns include none 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 Menstrual History: regular cycles, lasts 3-4 days  Confidential Social History: Tobacco?  no Secondhand smoke exposure?  no Drugs/ETOH?  no  Sexually Active?  no  - has been in the past, no past STD testing, declines testing today, no sx Pregnancy Prevention: abstinence   Safe at home, in school & in relationships?  Yes Safe to self?  Yes  PHQ reviewed and negative, denies depression or dealing with anxiety Depression screen [facility] 2/9 10/24/2020 03/25/2020 12/26/2019  Decreased Interest 0 0 0   Down, Depressed, Hopeless 0 0 0  PHQ - 2 Score 0 0 0  Altered sleeping - 0 0  Tired, decreased energy - 0 0  Change  in appetite - 0 0  Feeling bad or failure about yourself  - 0 0  Trouble concentrating - 0 0  Moving slowly or fidgety/restless - 0 0  Suicidal thoughts - 0 0  PHQ-9 Score - 0 0  Difficult doing work/chores - Not difficult at all Not difficult at all   Screenings: Patient has a dental home: yes  Vision:  Has prescription lenses -saw in the last year  In addition, the following topics were discussed as part of anticipatory guidance healthy eating, exercise, seatbelt use, abuse/trauma, tobacco use, marijuana use, drug use, condom use, birth control, sexuality, suicidality/self harm and mental health issues.   Physical Exam:  Vitals:   10/24/20 0859  BP: 104/68  Pulse: 89  Resp: 18  Temp: 98.1 F (36.7 C)  TempSrc: Oral  SpO2: 98%  Weight: 98 lb 12.8 oz (44.8 kg)  Height: 5\' 1"  (1.549 m)   BP 104/68   Pulse 89   Temp 98.1 F (36.7 C) (Oral)   Resp 18   Ht 5\' 1"  (1.549 m)   Wt 98 lb 12.8 oz (44.8 kg)   LMP 09/26/2020   SpO2 98%   BMI 18.67 kg/m  Body mass index: body mass index is 18.67 kg/m. Growth percentile [employer] can only be used for patients less than 23 years old.  Physical Exam Vitals and nursing note reviewed.  Constitutional:      General: She is not in acute distress.    Appearance: Normal appearance. She is not ill-appearing, toxic-appearing or diaphoretic.     Comments: Thin, well appearing young female, pleasant  HENT:     Head: Normocephalic and atraumatic.     Right Ear: External ear normal.     Left Ear: External ear normal.     Nose: Nose normal. No congestion or rhinorrhea.     Right Turbinates: Enlarged.     Left Turbinates: Enlarged.     Mouth/Throat:     Mouth: Mucous membranes are moist.     Pharynx: No oropharyngeal exudate or posterior oropharyngeal erythema.  Eyes:     General: No scleral icterus.       Right  eye: No discharge.        Left eye: No discharge.     Conjunctiva/sclera: Conjunctivae normal.     Pupils: Pupils are equal, round, and reactive to light.  Cardiovascular:     Rate and Rhythm: Normal rate and regular rhythm.     Pulses: Normal pulses.     Heart sounds: Normal heart sounds. No murmur heard.  No friction rub. No gallop.   Pulmonary:     Effort: Pulmonary effort is normal. No respiratory distress.     Breath sounds: Normal breath sounds. No stridor. No wheezing or rales.  Abdominal:     General: Bowel sounds are normal. There is no distension.     Palpations: Abdomen is soft.     Tenderness: There is no abdominal tenderness. There is no guarding.     Comments: PD catheter in place, with gauze and band/brace around abdomen holding tubing, abd skin and catheter clean, dry, no erythema, no induration or tenderness  Musculoskeletal:        General: Normal range of motion.     Cervical back: Normal range of motion and neck supple.     Right lower leg: No edema.     Left lower leg: No edema.  Skin:    General: Skin is warm and dry.  Coloration: Skin is not jaundiced or pale.     Findings: No rash.  Neurological:     Mental Status: She is alert. Mental status is at baseline.     Coordination: Coordination normal.     Gait: Gait normal.  Psychiatric:        Mood and Affect: Mood normal.        Behavior: Behavior normal.      Assessment and Plan:     ICD-10-CM   1. Encounter for general adult medical examination without abnormal findings  Z00.00   2. ESRD on peritoneal dialysis ([place])  N18.6    Z99.2    reviewed nephrology and [facility] records and recent labs, PD catheter well appearing, no signs of infection or complication  3. Hydronephrosis, unspecified hydronephrosis type  N13.30    per urology  4. Anemia in chronic kidney disease, on chronic dialysis ([facility])  N18.6    D63.1    Z99.2    last labs reviewed H/H improving and in normal range  5. Vitamin D  deficiency disease  E55.9    on supplement per Dr. Holley Raring  6. 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 Recurrent UTI  N39.0    no current sx  8. Hyperparathyroidism, secondary renal ([place])  N25.81    per nephrologist - labs reviewed  9. Vaccine counseling  Z71.85    discussed COVID vaccination recommendations for more than 5 min today, all questions asked and answered   BMI is appropriate for age - stable weight and BMI Body mass index is 18.67 kg/m.  Have asked staff to look up and document childhood vaccines from the state registry Flu shot is utd She declined STD testing today, but when doing next labs will get hep screening HIV screening any anything else she is due for, she declines today and just had labs done last week.  Health Maintenance  Topic Date Due  . Hepatitis C Screening  Never done  . TETANUS/TDAP  Never done  . COVID-19 Vaccine (1) 11/09/2020 (Originally 07/30/2012)  . HIV Screening  03/25/2021 (Originally 07/31/2015)  . INFLUENZA VACCINE  Completed   Tetanus likely due next year Discussed COVID vaccine and encouraged pt to get  Updated problem list after thorough care everywhere chart review Patient Active Problem List   Diagnosis Date Noted  . End stage renal disease ([place]) 10/24/2020  . Voiding dysfunction 04/30/2020  . 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 Anemia in chronic kidney disease 12/31/2019  . ESRD on peritoneal dialysis ([place]) 12/27/2019  . Hyperparathyroidism, secondary renal ([place]) 12/27/2019  . Vitamin D deficiency disease 12/27/2019  . Hydronephrosis 12/27/2019  . Lower urinary tract infectious disease 11/22/2013  . Recurrent UTI 11/17/2013   Current Outpatient Medications on File Prior to Visit  Medication Sig Dispense Refill  . cinacalcet (SENSIPAR) 30 MG tablet Take 30 mg by mouth daily.     No current facility-administered medications on file prior to visit.      Return in about 6 months (around 04/23/2021)  for Routine follow-up.Delsa Grana, PA-C

## 2020-11-18 ENCOUNTER — Other Ambulatory Visit: Payer: Self-pay

## 2020-11-18 ENCOUNTER — Emergency Department
Admission: EM | Admit: 2020-11-18 | Discharge: 2020-11-18 | Disposition: A | Discharge status: Home / Self Care | Payer: BC Managed Care – PPO | Attending: Student in an Organized Health Care Education/Training Program | Admitting: Student in an Organized Health Care Education/Training Program

## 2020-11-18 ENCOUNTER — Encounter: Payer: Self-pay | Admitting: Intensive Care

## 2020-11-18 DIAGNOSIS — N898 Other specified noninflammatory disorders of vagina: Secondary | ICD-10-CM | POA: Diagnosis present

## 2020-11-18 DIAGNOSIS — L292 Pruritus vulvae: Secondary | ICD-10-CM | POA: Insufficient documentation

## 2020-11-18 LAB — CHLAMYDIA/NGC RT PCR (ARMC ONLY)
Chlamydia Tr: NOT DETECTED
N gonorrhoeae: NOT DETECTED

## 2020-11-18 LAB — WET PREP, GENITAL
Sperm: NONE SEEN
Trich, Wet Prep: NONE SEEN
Yeast Wet Prep HPF POC: NONE SEEN

## 2020-11-18 MED ORDER — METRONIDAZOLE 500 MG PO TABS
2000.0000 mg | ORAL_TABLET | Freq: Once | ORAL | Status: AC
  Administered 2020-11-18: 2000 mg via ORAL
  Filled 2020-11-18: qty 4

## 2020-11-18 MED ORDER — METRONIDAZOLE 500 MG PO TABS: 500.0000 mg | ORAL_TABLET | Freq: Two times a day (BID) | ORAL | 0 refills | Status: AC

## 2020-11-18 MED ORDER — DOXYCYCLINE HYCLATE 100 MG PO CAPS: 100.0000 mg | ORAL_CAPSULE | Freq: Two times a day (BID) | ORAL | 0 refills | Status: AC

## 2020-11-18 MED ORDER — CEFTRIAXONE SODIUM 1 G IJ SOLR
500.0000 mg | Freq: Once | INTRAMUSCULAR | Status: AC
  Administered 2020-11-18: 500 mg via INTRAMUSCULAR
  Filled 2020-11-18: qty 10

## 2020-11-18 MED ORDER — DOXYCYCLINE HYCLATE 100 MG PO TABS
100.0000 mg | ORAL_TABLET | Freq: Once | ORAL | Status: AC
  Administered 2020-11-18: 100 mg via ORAL
  Filled 2020-11-18: qty 1

## 2020-11-18 NOTE — ED Provider Notes (Signed)
____________________________________________  Time seen: Approximately 4:57 PM  I have reviewed the triage vital signs and the nursing notes.   HISTORY  Chief Complaint Vaginal Pain   Historian Patient     HPI Ashley Garrison is a 19 y.o. female presents to the emergency department with changes in vaginal discharge for the past 3 to 4 days.  Patient also states that she has had some pleuritic vaginal bumps patient has never had before.  No fever or chills at home.  No dysuria, hematuria or increased urinary frequency.  She states that she has had vaginal pruritus.  No dyspareunia.  Denies a history of STDs in the past.  No other alleviating measures have been attempted.   Past Medical History:  Diagnosis Date  . Acute kidney injury ([place]) 11/17/2013  . Anemia 12/27/2019  . Constipation 11/19/2013  . Hydronephrosis   . Lower urinary tract infectious disease 11/22/2013   Overview:  IMO Problem List Replacer Jan. 2016  . Pyelonephritis 11/17/2013  . SIRS due to Gram-negative infection 11/19/2013  . Syncope 03/11/2016     Immunizations up to date:  Yes.     Past Medical History:  Diagnosis Date  . Acute kidney injury ([place]) 11/17/2013  . Anemia 12/27/2019  . Constipation 11/19/2013  . Hydronephrosis   . Lower urinary tract infectious disease 11/22/2013   Overview:  IMO Problem List Replacer Jan. 2016  . Pyelonephritis 11/17/2013  . SIRS due to Gram-negative infection 11/19/2013  . Syncope 03/11/2016    Patient Active Problem List   Diagnosis Date Noted  . End stage renal disease ([place]) 10/24/2020  . Voiding dysfunction 04/30/2020  . 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 Anemia in chronic kidney disease 12/31/2019  . ESRD on peritoneal dialysis ([place]) 12/27/2019  . 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 Vitamin D deficiency disease 12/27/2019  . Hydronephrosis 12/27/2019  . Lower urinary tract infectious disease 11/22/2013  . Recurrent UTI 11/17/2013    Past  Surgical History:  Procedure Laterality Date  . pd cath      Prior to Admission medications   Medication Sig Start Date End Date Taking? Authorizing Provider  cinacalcet (SENSIPAR) 30 MG tablet Take 30 mg by mouth daily.    Gillis Santa, MD  metroNIDAZOLE (FLAGYL) 500 MG tablet Take 1 tablet (500 mg total) by mouth 2 (two) times daily for 7 days. 11/18/20 11/25/20  Lannie Fields, PA-C    Allergies Other  History reviewed. No pertinent family history.  Social History Social History   Tobacco Use  . Smoking status: Never Smoker  . Smokeless tobacco: Never Used  Vaping Use  . Vaping Use: Never used  Substance Use Topics  . Alcohol use: No  . Drug use: Never     Review of Systems  Constitutional: No fever/chills Eyes:  No discharge ENT: No upper respiratory complaints. Respiratory: no cough. No SOB/ use of accessory muscles to breath Gastrointestinal:   No nausea, no vomiting.  No diarrhea.  No constipation. Genitourinary: Patient has changes in vaginal discharge.  Musculoskeletal: Negative for musculoskeletal pain. Skin: Negative for rash, abrasions, lacerations, ecchymosis.   ____________________________________________   PHYSICAL EXAM:  VITAL SIGNS: ED Triage Vitals  Enc Vitals Group     BP --      Pulse Rate 11/18/20 1519 (!) 102     Resp 11/18/20 1519 16     Temp 11/18/20 1519 99.1 F (37.3 C)     Temp Source 11/18/20 1519 Oral     SpO2  11/18/20 1519 96 %     Weight 11/18/20 1520 98 lb (44.5 kg)     Height 11/18/20 1520 5\' 1"  (1.549 m)     Head Circumference --      Peak Flow --      Pain Score 11/18/20 1520 8     Pain Loc --      Pain Edu? --      Excl. in [place]? --      Constitutional: Alert and oriented. Well appearing and in no acute distress. Eyes: Conjunctivae are normal. PERRL. EOMI. Head: Atraumatic. Cardiovascular: Normal rate, regular rhythm. Normal S1 and S2.  Good peripheral circulation. Respiratory: Normal respiratory effort without  tachypnea or retractions. Lungs CTAB. Good air entry to the bases with no decreased or absent breath sounds Gastrointestinal: Bowel sounds x 4 quadrants. Soft and nontender to palpation. No guarding or rigidity. No distention. Genitourinary: Patient does have papular, circumferential rash along labia majora.  No ulcerations visualized.  No vesicles. Musculoskeletal: Full range of motion to all extremities. No obvious deformities noted Neurologic:  Normal for age. No gross focal neurologic deficits are appreciated.  Skin:  Skin is warm, dry and intact. No rash noted. Psychiatric: Mood and affect are normal for age. Speech and behavior are normal.   ____________________________________________   LABS (all labs ordered are listed, but only abnormal results are displayed)  Labs Reviewed  WET PREP, GENITAL - Abnormal; Notable for the following components:      Result Value   Clue Cells Wet Prep HPF POC PRESENT (*)    WBC, Wet Prep HPF POC MANY (*)    All other components within normal limits  CHLAMYDIA/NGC RT PCR ([facility] ONLY)   ____________________________________________  EKG   ____________________________________________  RADIOLOGY  No results found.  ____________________________________________    PROCEDURES  Procedure(s) performed:     Procedures     Medications  doxycycline (VIBRA-TABS) tablet 100 mg (has no administration in time range)  cefTRIAXone (ROCEPHIN) injection 500 mg (500 mg Intramuscular Given 11/18/20 1732)  metroNIDAZOLE (FLAGYL) tablet 2,000 mg (2,000 mg Oral Given 11/18/20 1731)     ____________________________________________   INITIAL IMPRESSION / ASSESSMENT AND PLAN / ED COURSE  Pertinent labs & imaging results that were available during my care of the patient were reviewed by me and considered in my medical decision making (see chart for details).      Assessment and plan:  Vaginal pruritis Changes in vaginal discharge.   19 year old female presents to the emergency department with vaginal pruritus and changes in vaginal discharge for the past 4 days.  Patient elected to be empirically treated for gonorrhea, chlamydia and trichomoniasis.  Wet prep findings suggest BV.  Patient received Rocephin and Flagyl in the emergency department.  She was discharged with Flagyl and doxycycline.  Return precautions were given to return with new or worsening symptoms.   ____________________________________________  FINAL CLINICAL IMPRESSION(S) / ED DIAGNOSES  Final diagnoses:  Vaginal pruritus      NEW MEDICATIONS STARTED DURING THIS VISIT:  ED Discharge Orders         Ordered    metroNIDAZOLE (FLAGYL) 500 MG tablet  2 times daily        11/18/20 1751              This chart was dictated using voice recognition software/Dragon. Despite best efforts to proofread, errors can occur which can change the meaning. Any change was purely unintentional.     Lannie Fields, PA-C  11/18/20 1811    Merlyn Lot, MD 11/18/20 2221

## 2020-11-18 NOTE — ED Notes (Signed)
Pt states bumps, irritation and pain in vaginal area.

## 2020-11-18 NOTE — Discharge Instructions (Signed)
Take Doxycycline twice daily for the next seven days.  Take Flagyl twice a day for the next seven days.

## 2020-11-18 NOTE — ED Triage Notes (Signed)
Patient c/o open vaginal sores and green discharge X4 days.

## 2021-01-13 DIAGNOSIS — N186 End stage renal disease: Secondary | ICD-10-CM | POA: Insufficient documentation

## 2021-01-13 DIAGNOSIS — Z992 Dependence on renal dialysis: Secondary | ICD-10-CM | POA: Insufficient documentation

## 2021-01-13 DIAGNOSIS — R109 Unspecified abdominal pain: Secondary | ICD-10-CM | POA: Insufficient documentation

## 2021-01-14 ENCOUNTER — Emergency Department
Admission: EM | Admit: 2021-01-14 | Discharge: 2021-01-14 | Discharge status: Against Medical Advice | Payer: BC Managed Care – PPO | Attending: Emergency Medicine | Admitting: Emergency Medicine

## 2021-01-14 ENCOUNTER — Other Ambulatory Visit: Payer: Self-pay

## 2021-01-14 ENCOUNTER — Encounter: Payer: Self-pay | Admitting: Emergency Medicine

## 2021-01-14 DIAGNOSIS — R109 Unspecified abdominal pain: Secondary | ICD-10-CM

## 2021-01-14 LAB — URINALYSIS, COMPLETE (UACMP) WITH MICROSCOPIC
Bilirubin Urine: NEGATIVE
Glucose, UA: NEGATIVE mg/dL
Ketones, ur: NEGATIVE mg/dL
Nitrite: NEGATIVE
Protein, ur: NEGATIVE mg/dL
Specific Gravity, Urine: 1.003 — ABNORMAL LOW (ref 1.005–1.030)
WBC, UA: >50 WBC/hpf — ABNORMAL HIGH (ref 0–5)
pH: 7 (ref 5.0–8.0)

## 2021-01-14 LAB — CBC
HCT: 36.8 % (ref 36.0–46.0)
Hemoglobin: 12.1 g/dL (ref 12.0–15.0)
MCH: 29.4 pg (ref 26.0–34.0)
MCHC: 32.9 g/dL (ref 30.0–36.0)
MCV: 89.3 fL (ref 80.0–100.0)
Platelets: 187 10*3/uL (ref 150–400)
RBC: 4.12 MIL/uL (ref 3.87–5.11)
RDW: 12.9 % (ref 11.5–15.5)
WBC: 13.4 10*3/uL — ABNORMAL HIGH (ref 4.0–10.5)
nRBC: 0 % (ref 0.0–0.2)

## 2021-01-14 LAB — COMPREHENSIVE METABOLIC PANEL
ALT: 10 U/L (ref 0–44)
AST: 15 U/L (ref 15–41)
Albumin: 3.8 g/dL (ref 3.5–5.0)
Alkaline Phosphatase: 133 U/L — ABNORMAL HIGH (ref 38–126)
Anion gap: 10 (ref 5–15)
BUN: 23 mg/dL — ABNORMAL HIGH (ref 6–20)
CO2: 26 mmol/L (ref 22–32)
Calcium: 8.1 mg/dL — ABNORMAL LOW (ref 8.9–10.3)
Chloride: 103 mmol/L (ref 98–111)
Creatinine, Ser: 5.26 mg/dL — ABNORMAL HIGH (ref 0.44–1.00)
GFR, Estimated: 11 mL/min — ABNORMAL LOW (ref >60)
Glucose, Bld: 121 mg/dL — ABNORMAL HIGH (ref 70–99)
Potassium: 3.1 mmol/L — ABNORMAL LOW (ref 3.5–5.1)
Sodium: 139 mmol/L (ref 135–145)
Total Bilirubin: 0.8 mg/dL (ref 0.3–1.2)
Total Protein: 6.6 g/dL (ref 6.5–8.1)

## 2021-01-14 LAB — LIPASE, BLOOD: Lipase: 53 U/L — ABNORMAL HIGH (ref 11–51)

## 2021-01-14 LAB — POC URINE PREG, ED: Preg Test, Ur: NEGATIVE

## 2021-01-14 MED ORDER — IOHEXOL 9 MG/ML PO SOLN
500.0000 mL | ORAL | Status: DC
  Administered 2021-01-14: 500 mL via ORAL

## 2021-01-14 NOTE — ED Provider Notes (Addendum)
[facility] Emergency Department Provider Note  ____________________________________________   Event Date/Time   First MD Initiated Contact with Patient 01/14/21 0118     (approximate)  I have reviewed the triage vital signs and the nursing notes.   HISTORY  Chief Complaint Abdominal Pain    HPI Ashley Garrison is a 20 y.o. female with history of end-stage renal disease on peritoneal dialysis, hyperparathyroidism who presents to emergency department left mid abdominal pain that started today.  Describes it as sharp in nature but has improved.  No associated fevers, nausea, vomiting, diarrhea or dysuria, hematuria, vaginal bleeding or discharge.  Last menstrual cycle was 12/28/2020.  States her dialysis at fluid is normal in appearance.  Is not cloudy, bloody.  She has never had similar symptoms.  No aggravating or alleviating factors.  No radiation of pain.  States she had shaking chills today and felt like she was going to pass out.  No chest pain or shortness of breath.        Past Medical History:  Diagnosis Date  . Acute kidney injury ([place]) 11/17/2013  . Anemia 12/27/2019  . Constipation 11/19/2013  . Hydronephrosis   . Lower urinary tract infectious disease 11/22/2013   Overview:  IMO Problem List Replacer Jan. 2016  . Pyelonephritis 11/17/2013  . SIRS due to Gram-negative infection 11/19/2013  . Syncope 03/11/2016    Patient Active Problem List   Diagnosis Date Noted  . End stage renal disease ([place]) 10/24/2020  . Voiding dysfunction 04/30/2020  . 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 Anemia in chronic kidney disease 12/31/2019  . ESRD on peritoneal dialysis ([place]) 12/27/2019  . 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 Vitamin D deficiency disease 12/27/2019  . Hydronephrosis 12/27/2019  . Lower urinary tract infectious disease 11/22/2013  . 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 pd cath       Prior to Admission medications   Medication Sig Start Date End Date Taking? Authorizing Provider  cinacalcet (SENSIPAR) 30 MG tablet Take 30 mg by mouth daily.    Gillis Santa, MD    Allergies Other  History reviewed. No pertinent family history.  Social History Social History   Tobacco Use  . Smoking status: Never Smoker  . Smokeless tobacco: Never Used  Vaping Use  . Vaping Use: Never used  Substance Use Topics  . Alcohol use: No  . Drug use: Never    Review of Systems Constitutional: No fever. Eyes: No visual changes. ENT: No sore throat. Cardiovascular: Denies chest pain. Respiratory: Denies shortness of breath. Gastrointestinal: No nausea, vomiting, diarrhea. Genitourinary: Negative for dysuria. Musculoskeletal: Negative for back pain. Skin: Negative for rash. Neurological: Negative for focal weakness or numbness.  ____________________________________________   PHYSICAL EXAM:  VITAL SIGNS: ED Triage Vitals [01/14/21 0009]  Enc Vitals Group     BP 107/61     Pulse Rate 87     Resp 16     Temp 98.7 F (37.1 C)     Temp Source Oral     SpO2 96 %     Weight 95 lb (43.1 kg)     Height '5\' 1"'$  (1.549 m)     Head Circumference      Peak Flow      Pain Score 6     Pain Loc      Pain Edu?      Excl. in [place]?    CONSTITUTIONAL: Alert and oriented  and responds appropriately to questions. Well-appearing; well-nourished HEAD: Normocephalic EYES: Conjunctivae clear, pupils appear equal, EOM appear intact ENT: normal nose; moist mucous membranes NECK: Supple, normal ROM CARD: RRR; S1 and S2 appreciated; no murmurs, no clicks, no rubs, no gallops RESP: Normal chest excursion without splinting or tachypnea; breath sounds clear and equal bilaterally; no wheezes, no rhonchi, no rales, no hypoxia or respiratory distress, speaking full sentences ABD/GI: Normal bowel sounds; non-distended; soft, only tender in the left mid abdomen, no rebound, no guarding, no  peritoneal signs, no hepatosplenomegaly; PD catheter in the right abdomen with no surrounding redness, warmth or drainage BACK: The back appears normal EXT: Normal ROM in all joints; no deformity noted, no edema; no cyanosis SKIN: Normal color for age and race; warm; no rash on exposed skin NEURO: Moves all extremities equally PSYCH: The patient's mood and manner are appropriate.  ____________________________________________   LABS (all labs ordered are listed, but only abnormal results are displayed)  Labs Reviewed  LIPASE, BLOOD - Abnormal; Notable for the following components:      Result Value   Lipase 53 (*)    All other components within normal limits  COMPREHENSIVE METABOLIC PANEL - Abnormal; Notable for the following components:   Potassium 3.1 (*)    Glucose, Bld 121 (*)    BUN 23 (*)    Creatinine, Ser 5.26 (*)    Calcium 8.1 (*)    Alkaline Phosphatase 133 (*)    GFR, Estimated 11 (*)    All other components within normal limits  CBC - Abnormal; Notable for the following components:   WBC 13.4 (*)    All other components within normal limits  URINALYSIS, COMPLETE (UACMP) WITH MICROSCOPIC  POC URINE PREG, ED   ____________________________________________  EKG  none ____________________________________________  RADIOLOGY I, Ebany Bowermaster, personally viewed and evaluated these images (plain radiographs) as part of my medical decision making, as well as reviewing the written report by the radiologist.  ED MD interpretation:  None  Official radiology report(s): No results found.  ____________________________________________   PROCEDURES  Procedure(s) performed (including Critical Care):  None  ____________________________________________   INITIAL IMPRESSION / ASSESSMENT AND PLAN / ED COURSE  As part of my medical decision making, I reviewed the following data within the [place] notes reviewed and incorporated, Labs  reviewed, Old chart reviewed and Notes from prior ED visits         Patient here with left-sided abdominal pain.  Abdominal exam is rather benign at this time and she states her pain is improved.  She is afebrile.  Labs obtained in triage does show leukocytosis of 13,000.  Potassium slightly low at 3.1 but will hold on giving any replacement given she has history of renal failure.  She reports her dialysis fluid has been normal in appearance today.  Her LFTs are unremarkable.  Very minimal elevation of lipase noted today.  Differential includes gastritis, pancreatitis, colitis, UTI, pyelonephritis, kidney stone.  She is not having any GU symptoms at this time and has no pelvic tenderness on my exam.  Doubt appendicitis.  Doubt SBP given very benign abdominal exam.  We will proceed with CT of the abdomen pelvis for further evaluation.  Have offered pain medication which she declines.     1:57 AM  Pt now refusing EKG and CT of the abdomen pelvis which she initially agreed to.  Stated to nursing staff and significant other at bedside "it is not that serious".  She states she plans to follow-up with her dialysis doctor.  Discussed with patient that we cannot rule out life-threatening illness without further evaluation.  She verbalized understanding and states she does not want further work-up today.  She agrees to stay and wait for her urinalysis results but then would like to be discharged.  She agrees to sign out [place].  She is oriented.  She is not intoxicated.  She appears to understand risk of leaving without further work-up.  We discussed return precautions.  2:06 AM  Pt now does not want to wait for her urine results and states she will follow up on urine test through MyChart.  She states she has an appointment to see her doctor today.  I reviewed all nursing notes and pertinent previous records as available.  I have reviewed and interpreted any EKGs, lab and urine results, imaging  (as available).   2:40 AM  Pt's urine appears infected with large leukocytes, greater than 50 Legendre blood cells and many bacteria.  Have added on a urine culture.  We have no previous urine cultures in our system for review.  Her pregnancy test was negative.  I have attempted to contact patient by phone without answer to discuss these results and to call in antibiotics.  Unable to confirm pharmacy with patient. ____________________________________________   FINAL CLINICAL IMPRESSION(S) / ED DIAGNOSES  Final diagnoses:  Left sided abdominal pain     ED Discharge Orders    None      *Please note:  Ashley Garrison was evaluated in Emergency Department on 01/14/2021 for the symptoms described in the history of present illness. She was evaluated in the context of the global COVID-19 pandemic, which necessitated consideration that the patient might be at risk for infection with the SARS-CoV-2 virus that causes COVID-19. Institutional protocols and algorithms that pertain to the evaluation of patients at risk for COVID-19 are in a state of rapid change based on information released by regulatory bodies including the CDC and federal and state organizations. These policies and algorithms were followed during the patient's care in the ED.  Some ED evaluations and interventions may be delayed as a result of limited staffing during and the pandemic.*   Note:  This document was prepared using Dragon voice recognition software and may include unintentional dictation errors.   Jamarii Banks, Delice Bison, DO 01/14/21 0207    Lessa Huge, Delice Bison, DO 01/14/21 [phone]

## 2021-01-14 NOTE — ED Triage Notes (Signed)
Pt to ED via EMS from home c/o left abd pain that started tonight.  Pt does PD at home and was about to start it tonight when abd started hurting and nausea, denies vomiting.  Pt states feeling a little better at this time. Denies urinary changes.  Pt A&Ox4, chest rise even and unlabored.

## 2021-01-14 NOTE — ED Notes (Signed)
Pt signed AMA papers. Pt refused to have ct scan done and also EKG.

## 2021-01-14 NOTE — Discharge Instructions (Addendum)
We have recommended an EKG given you felt like you are going to pass out today.  We have also recommended a CT of your abdomen pelvis given your left-sided abdominal pain with an elevated [place] blood cell count and a minimally elevated lipase (pancreatic enzyme).  You have refused further work-up.  We are unable to rule out life-threatening illness without further evaluation.  If you have worsening pain, chest pain or shortness of breath, fevers, vomiting, blood in your stool or black tarry stools or any symptom concerning to you, please return to the emergency department immediately.  If you change your mind and would like further evaluation, we are happy to see you at any time.

## 2021-01-15 ENCOUNTER — Telehealth: Payer: Self-pay | Admitting: Family Medicine

## 2021-01-15 LAB — URINE CULTURE: Culture: >=100000 — AB

## 2021-01-15 MED ORDER — CEPHALEXIN 500 MG PO CAPS: 500.0000 mg | ORAL_CAPSULE | Freq: Two times a day (BID) | ORAL | 0 refills | Status: AC

## 2021-01-16 NOTE — Telephone Encounter (Signed)
Patient mother notified.

## 2021-02-12 ENCOUNTER — Other Ambulatory Visit (HOSPITAL_COMMUNITY): Payer: Self-pay | Admitting: Nephrology

## 2021-02-12 ENCOUNTER — Other Ambulatory Visit: Payer: Self-pay | Admitting: Nephrology

## 2021-02-12 DIAGNOSIS — Z992 Dependence on renal dialysis: Secondary | ICD-10-CM

## 2021-02-12 DIAGNOSIS — N839 Noninflammatory disorder of ovary, fallopian tube and broad ligament, unspecified: Secondary | ICD-10-CM

## 2021-02-12 DIAGNOSIS — N186 End stage renal disease: Secondary | ICD-10-CM

## 2021-02-12 DIAGNOSIS — R102 Pelvic and perineal pain: Secondary | ICD-10-CM

## 2021-02-17 ENCOUNTER — Ambulatory Visit
Admission: RE | Admit: 2021-02-17 | Discharge: 2021-02-17 | Disposition: A | Discharge status: Home / Self Care | Payer: BC Managed Care – PPO | Source: Ambulatory Visit | Attending: Nephrology | Admitting: Nephrology

## 2021-02-17 ENCOUNTER — Other Ambulatory Visit: Payer: Self-pay | Admitting: Nephrology

## 2021-02-17 ENCOUNTER — Other Ambulatory Visit: Payer: Self-pay

## 2021-02-17 DIAGNOSIS — Z992 Dependence on renal dialysis: Secondary | ICD-10-CM | POA: Insufficient documentation

## 2021-02-17 DIAGNOSIS — N186 End stage renal disease: Secondary | ICD-10-CM | POA: Insufficient documentation

## 2021-02-17 DIAGNOSIS — N839 Noninflammatory disorder of ovary, fallopian tube and broad ligament, unspecified: Secondary | ICD-10-CM

## 2021-02-17 DIAGNOSIS — R102 Pelvic and perineal pain: Secondary | ICD-10-CM

## 2021-04-23 ENCOUNTER — Ambulatory Visit: Payer: BC Managed Care – PPO | Admitting: Family Medicine

## 2021-05-12 ENCOUNTER — Ambulatory Visit: Payer: BC Managed Care – PPO | Admitting: Family Medicine

## 2021-05-19 ENCOUNTER — Ambulatory Visit: Payer: BC Managed Care – PPO | Admitting: Unknown Physician Specialty

## 2021-05-20 IMAGING — US US RENAL
2 series · 13 of 25 positions shown · non-contrast
Comparison: None.

CLINICAL DATA: End-stage renal disease, peritoneal dialysis,
hematuria

EXAM:
RENAL / URINARY TRACT ULTRASOUND COMPLETE

[Series 1: us renal · 0.20mm/px · 12 of 35 slices shown (1 of 2)]
[im 1/35]
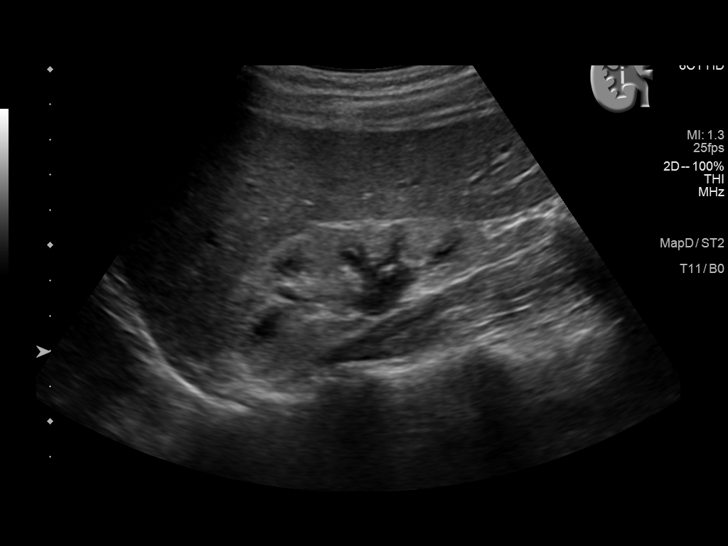
[im 4/35]
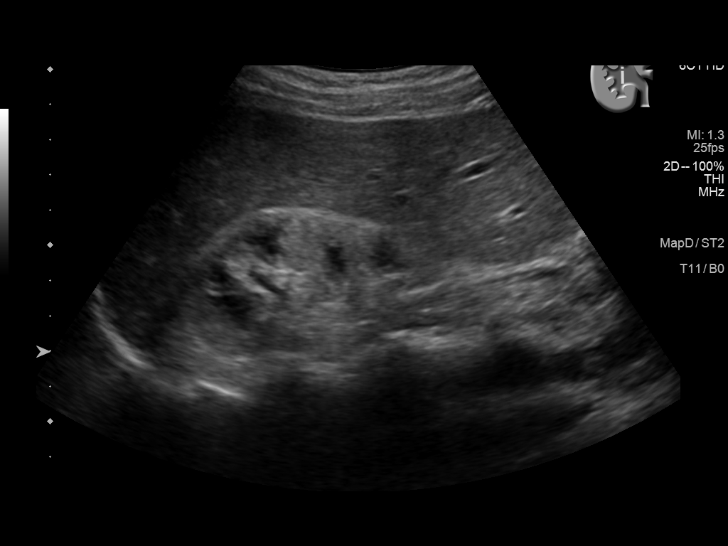
[im 7/35]
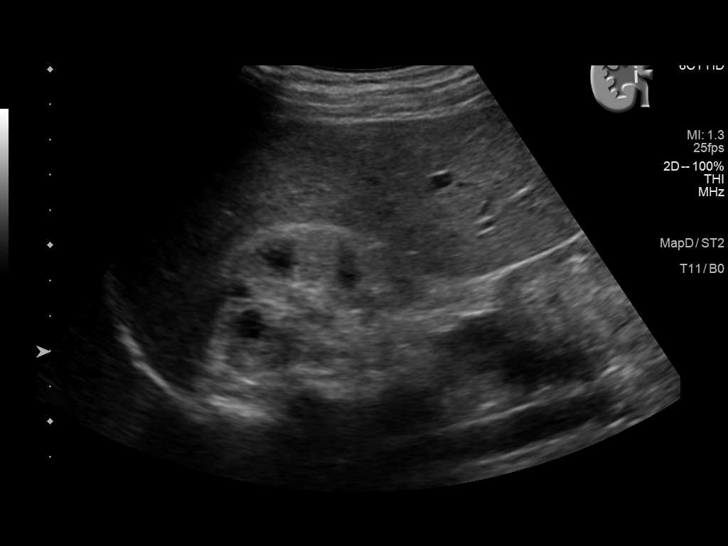
[im 10/35]
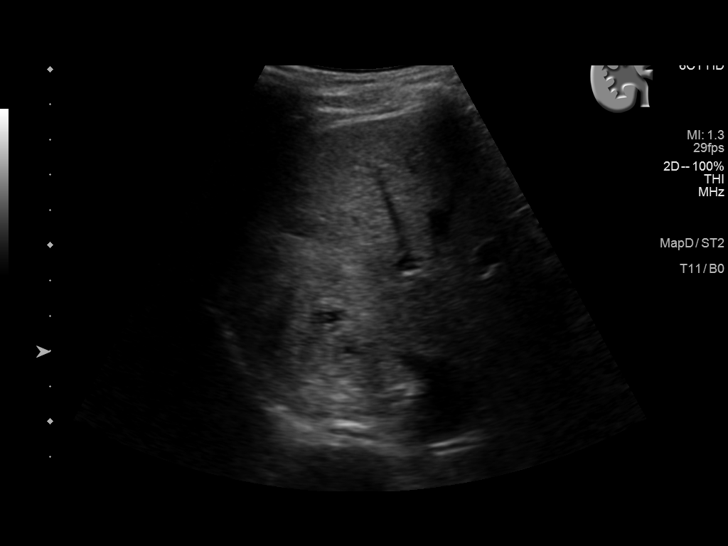
[im 13/35]
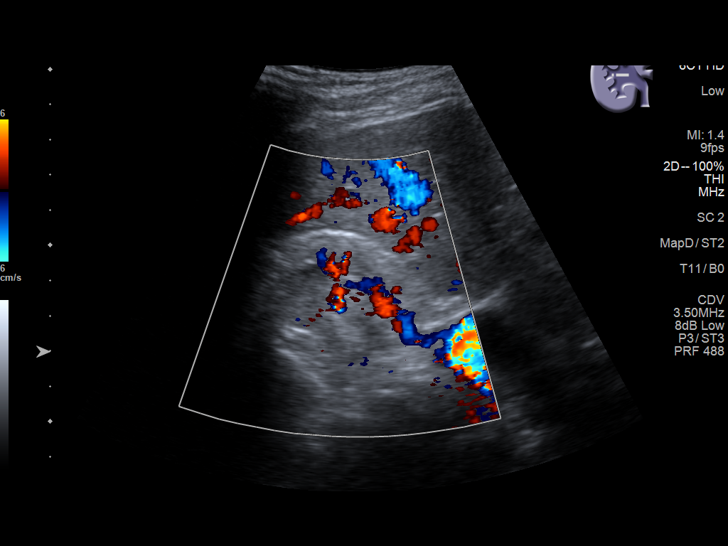
[im 16/35]
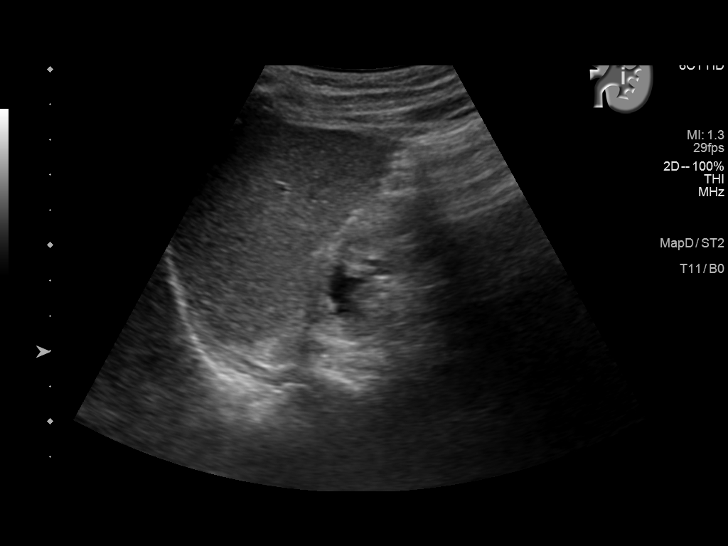
[im 19/35]
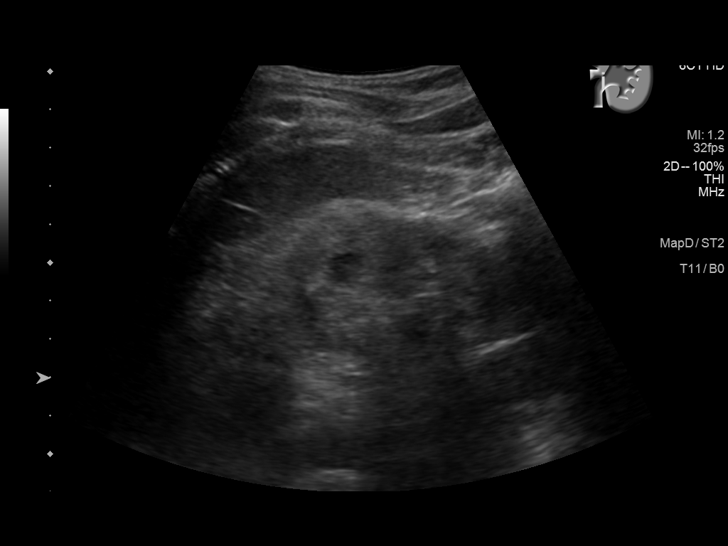
[im 22/35]
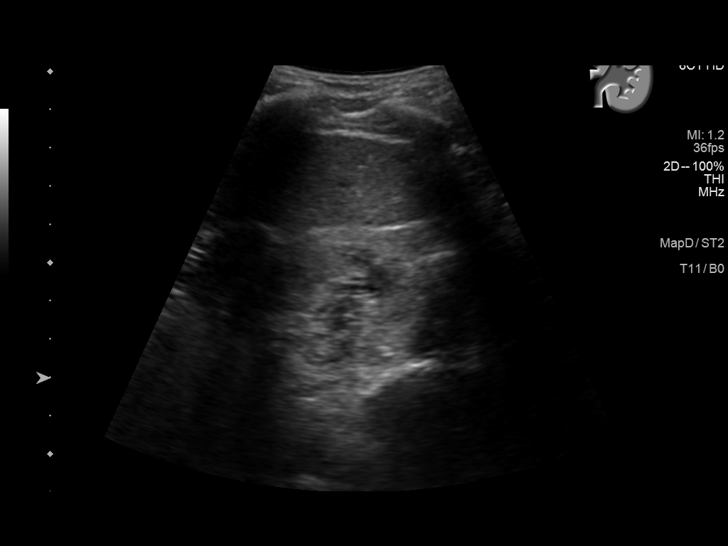
[im 25/35]
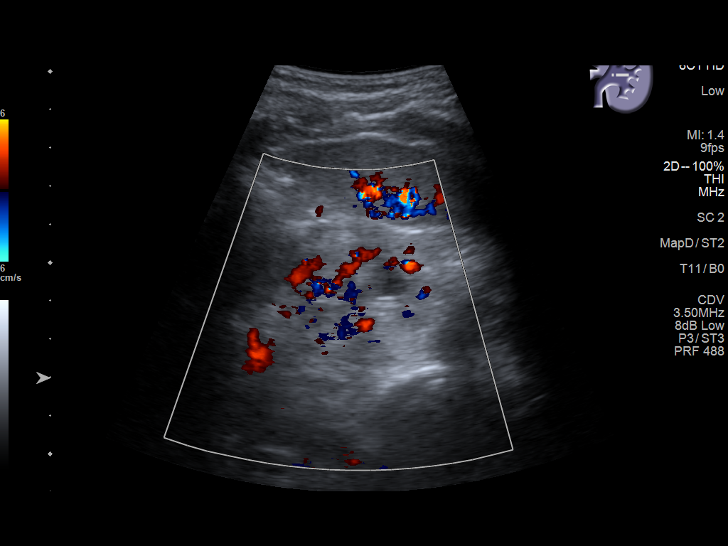
[im 28/35]
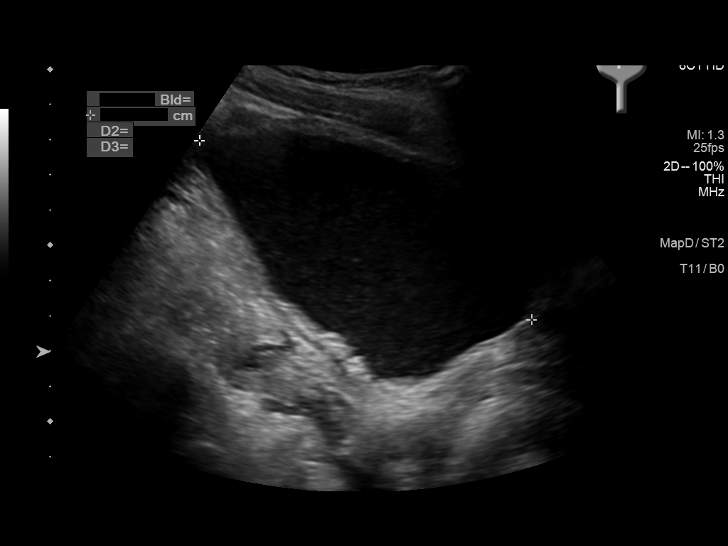
[im 31/35]
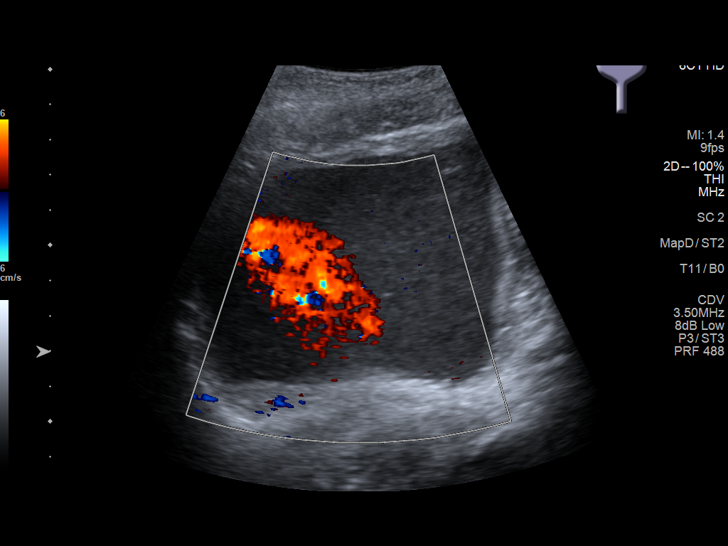
[im 35/35]
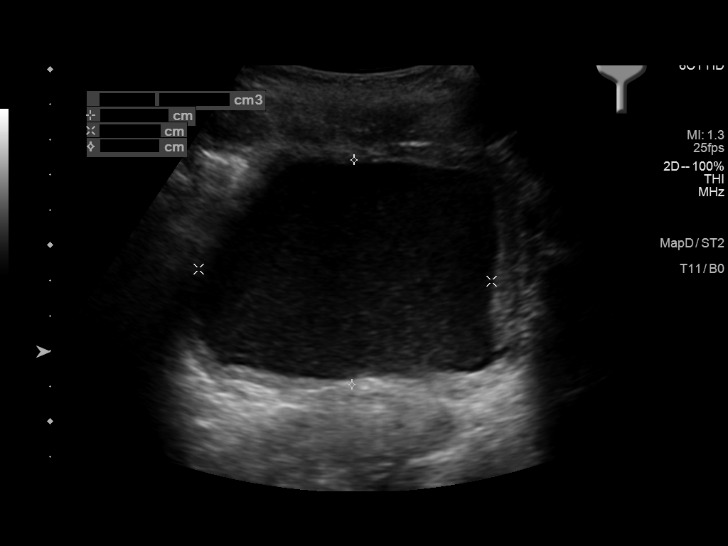

[Series 2001: us renal · 0.23mm/px · 1 of 3 slices shown (2 of 2)]
[im 3/3]
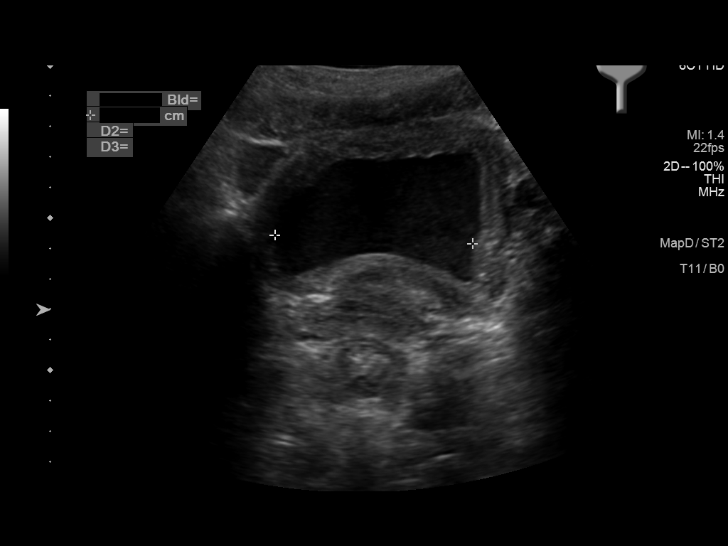

[13 of 25 positions shown; findings below may reference images not displayed]

FINDINGS: Right Kidney:

Renal measurements: 9.6 x 3.6 x 4.6 cm = volume: 76 mL. The renal
cortex is mildly thinned and demonstrates diffused increased renal
cortical echogenicity in keeping with changes of underlying medical
renal disease. No hydronephrosis. No intrarenal masses or
calcifications are seen.

Left Kidney:

Renal measurements: 7.3 x 3.3 x 3.9 cm = volume: 50 mL. The left
kidney is atrophic and there is diffusely increased renal cortical
echogenicity in keeping with underlying medical renal disease. No
hydronephrosis. No intrarenal masses or calcifications are seen.

Bladder:

The bladder wall appears mildly, diffusely thickened. There is
echogenic debris seen throughout the bladder lumen. Bilateral
ureteral jets are identified.

Other:

None.
IMPRESSION: Atrophic, echogenic kidneys in keeping with chronic renal failure.

Echogenic debris throughout the bladder lumen likely representing
hemorrhagic or proteinaceous debris

Mild diffuse bladder wall thickening, nonspecific.

## 2022-03-25 ENCOUNTER — Ambulatory Visit: Payer: BC Managed Care – PPO | Admitting: Family Medicine

## 2022-03-25 ENCOUNTER — Encounter: Payer: Self-pay | Admitting: Family Medicine

## 2022-03-25 ENCOUNTER — Other Ambulatory Visit (HOSPITAL_COMMUNITY)
Admission: RE | Admit: 2022-03-25 | Discharge: 2022-03-25 | Disposition: A | Discharge status: Home / Self Care | Payer: BC Managed Care – PPO | Source: Ambulatory Visit | Attending: Family Medicine | Admitting: Family Medicine

## 2022-03-25 VITALS — BP 114/68 | HR 76 | Temp 97.7°F | Resp 16 | Ht 61.0 in | Wt 104.5 lb

## 2022-03-25 DIAGNOSIS — Z992 Dependence on renal dialysis: Secondary | ICD-10-CM

## 2022-03-25 DIAGNOSIS — O0992 Supervision of high risk pregnancy, unspecified, second trimester: Secondary | ICD-10-CM | POA: Diagnosis not present

## 2022-03-25 DIAGNOSIS — N186 End stage renal disease: Secondary | ICD-10-CM

## 2022-03-25 DIAGNOSIS — Z113 Encounter for screening for infections with a predominantly sexual mode of transmission: Secondary | ICD-10-CM

## 2022-03-25 NOTE — Patient Instructions (Signed)
[phone] - call to f/up on referral to high-risk  ?

## 2022-03-25 NOTE — Progress Notes (Signed)
? ? ?Patient ID: Ashley Garrison, female    DOB: September 19, 2001, 21 y.o.   MRN: [id number] ? ?PCP: Delsa Grana, PA-C ? ?Chief Complaint  ?Patient presents with  ? Advice Only  ?  Pt found ut 2 weeks ago she is pregnant and is concerned due to her health. Pt pregnancy was confirmed at [facility]  ? ? ?Subjective:  ? ?Ashley Garrison is a 21 y.o. female, presents to clinic with CC of the following: ? ?HPI  ?Pt presents with general health concerns, is roughly [redacted] weeks EGA, saw OBGYN at [facility], saw CNM, they have placed referral to see OBGYN at [facility] for higher level of care - she has not heard from [facility] yet ?She has ESRD on peritoneal dialysis - sees Dr. Holley Raring nephrology  ?They do monthly appt with Dr. Holley Raring, I cannot see labs, but mom states last labs were good, mildly low potassium and iron which he was treating ? ?One female sexual partner, no hx of STD's ?Reviewed her [facility] OV, [place] and other results and notes ? ?Pt has had a good appetite, no N, V ?No swelling, weight changes ? ?Dr. Holley Raring is not aware of pregnancy yet - possibly  ? ? ? ?Patient Active Problem List  ? Diagnosis Date Noted  ? End stage renal disease ([place]) 10/24/2020  ? 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 Anemia in chronic kidney disease 12/31/2019  ? ESRD on peritoneal dialysis ([place]) 12/27/2019  ? 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 Vitamin D deficiency disease 12/27/2019  ? Hydronephrosis 12/27/2019  ? Lower urinary tract infectious disease 11/22/2013  ? Recurrent UTI 11/17/2013  ? ? ? ? ?Current Outpatient Medications:  ?  cinacalcet (SENSIPAR) 30 MG tablet, Take 30 mg by mouth daily. (Patient not taking: Reported on 03/25/2022), Disp: , Rfl:  ? ? ?Allergies  ?Allergen Reactions  ? Other   ?  fruit ?Other reaction(s): Other (See Comments) ?Angioedema; undiagnosed as to what.  ? ? ? ?Social History  ? ?Tobacco Use  ? Smoking status: Never  ? Smokeless tobacco: Never  ?Vaping Use   ? Vaping Use: Never used  ?Substance Use Topics  ? Alcohol use: No  ? Drug use: Never  ?  ? ? ?Chart Review Today: ?I personally reviewed active problem list, medication list, allergies, family history, social history, health maintenance, notes from last encounter, lab results, imaging with the patient/caregiver today. ? ? ?Review of Systems  ?Constitutional: Negative.   ?HENT: Negative.    ?Eyes: Negative.   ?Respiratory: Negative.    ?Cardiovascular: Negative.   ?Gastrointestinal: Negative.   ?Endocrine: Negative.   ?Genitourinary: Negative.   ?Musculoskeletal: Negative.   ?Skin: Negative.   ?Allergic/Immunologic: Negative.   ?Neurological: Negative.   ?Hematological: Negative.   ?Psychiatric/Behavioral: Negative.    ?All other systems reviewed and are negative. ? ?   ?Objective:  ? ?Vitals:  ? 03/25/22 1514  ?BP: 114/68  ?Pulse: 76  ?Resp: 16  ?Temp: 97.7 ?F (36.5 ?C)  ?TempSrc: Oral  ?SpO2: 98%  ?Weight: 104 lb 8 oz (47.4 kg)  ?Height: 5\' 1"  (1.549 m)  ?  ?Body mass index is 19.75 kg/m?. ? ?Physical Exam ?Vitals reviewed.  ?Constitutional:   ?   General: She is not in acute distress. ?   Appearance: Normal appearance. She is not ill-appearing, toxic-appearing or diaphoretic.  ?HENT:  ?   Head: Normocephalic and atraumatic.  ?   Right Ear: External ear  normal.  ?   Left Ear: External ear normal.  ?Eyes:  ?   General: No scleral icterus.    ?   Right eye: No discharge.     ?   Left eye: No discharge.  ?   Conjunctiva/sclera: Conjunctivae normal.  ?Cardiovascular:  ?   Rate and Rhythm: Normal rate and regular rhythm.  ?   Pulses: Normal pulses.  ?   Heart sounds: Normal heart sounds. No murmur heard. ?  No friction rub. No gallop.  ?Pulmonary:  ?   Effort: Pulmonary effort is normal. No respiratory distress.  ?   Breath sounds: Normal breath sounds. No stridor. No wheezing or rales.  ?Abdominal:  ?   General: Bowel sounds are normal.  ?Musculoskeletal:  ?   Right lower leg: No edema.  ?   Left lower leg: No edema.   ?Skin: ?   General: Skin is warm and dry.  ?   Coloration: Skin is not jaundiced or pale.  ?   Findings: No lesion or rash.  ?Neurological:  ?   Mental Status: She is alert. Mental status is at baseline.  ?   Gait: Gait normal.  ?Psychiatric:     ?   Mood and Affect: Mood normal.  ?  ? ?Results for orders placed or performed during the hospital encounter of 01/14/21  ?Urine Culture  ? Specimen: Urine, Random  ?Result Value Ref Range  ? Specimen Description    ?  URINE, RANDOM ?Performed at [facility], [address] [place], [place] [address] ?  ? Special Requests    ?  NONE ?Performed at [facility], [address] [place], [place] [address] ?  ? Culture >=100,000 COLONIES/mL VIRIDANS STREPTOCOCCUS (A)   ? Report Status 01/15/2021 FINAL   ?Lipase, blood  ?Result Value Ref Range  ? Lipase 53 (H) 11 - 51 U/L  ?Comprehensive metabolic panel  ?Result Value Ref Range  ? Sodium 139 135 - 145 mmol/L  ? Potassium 3.1 (L) 3.5 - 5.1 mmol/L  ? Chloride 103 98 - 111 mmol/L  ? CO2 26 22 - 32 mmol/L  ? Glucose, Bld 121 (H) 70 - 99 mg/dL  ? BUN 23 (H) 6 - 20 mg/dL  ? Creatinine, Ser 5.26 (H) 0.44 - 1.00 mg/dL  ? Calcium 8.1 (L) 8.9 - 10.3 mg/dL  ? Total Protein 6.6 6.5 - 8.1 g/dL  ? Albumin 3.8 3.5 - 5.0 g/dL  ? AST 15 15 - 41 U/L  ? ALT 10 0 - 44 U/L  ? Alkaline Phosphatase 133 (H) 38 - 126 U/L  ? Total Bilirubin 0.8 0.3 - 1.2 mg/dL  ? GFR, Estimated 11 (L) >60 mL/min  ? Anion gap 10 5 - 15  ?CBC  ?Result Value Ref Range  ? WBC 13.4 (H) 4.0 - 10.5 K/uL  ? RBC 4.12 3.87 - 5.11 MIL/uL  ? Hemoglobin 12.1 12.0 - 15.0 g/dL  ? HCT 36.8 36.0 - 46.0 %  ? MCV 89.3 80.0 - 100.0 fL  ? MCH 29.4 26.0 - 34.0 pg  ? MCHC 32.9 30.0 - 36.0 g/dL  ? RDW 12.9 11.5 - 15.5 %  ? Platelets 187 150 - 400 K/uL  ? nRBC 0.0 0.0 - 0.2 %  ?Urinalysis, Complete w Microscopic  ?Result Value Ref Range  ? Color, Urine YELLOW (A) YELLOW  ? APPearance CLOUDY (A) CLEAR  ? Specific Gravity, Urine 1.003 (L) 1.005 - 1.030  ? pH 7.0 5.0 -  8.0   ? Glucose, UA NEGATIVE NEGATIVE mg/dL  ? Hgb urine dipstick SMALL (A) NEGATIVE  ? Bilirubin Urine NEGATIVE NEGATIVE  ? Ketones, ur NEGATIVE NEGATIVE mg/dL  ? Protein, ur NEGATIVE NEGATIVE mg/dL  ? Nitrite NEGATIVE NEGATIVE  ? Leukocytes,Ua LARGE (A) NEGATIVE  ? WBC, UA >50 (H) 0 - 5 WBC/hpf  ? Bacteria, UA MANY (A) NONE SEEN  ? Squamous Epithelial / LPF 0-5 0 - 5  ? Mucus PRESENT   ?POC urine preg, ED  ?Result Value Ref Range  ? Preg Test, Ur NEGATIVE NEGATIVE  ? ? ?   ?Assessment & Plan:  ? ?  ICD-10-CM   ?1. ESRD on peritoneal dialysis ([place])  N18.6   ? Z99.2   ?  ?2. Screening examination for STD (sexually transmitted disease)  Z11.3 Cervicovaginal ancillary only  ?  HIV Antibody (routine testing w rflx)  ?  RPR  ?  Urine Culture  ?  Urinalysis, Routine w reflex microscopic  ?  ?3. High-risk pregnancy in second trimester  O09.92 Cervicovaginal ancillary only  ?  HIV Antibody (routine testing w rflx)  ?  RPR  ?  Urine Culture  ?  Urinalysis, Routine w reflex microscopic  ?  ? ?Recommended f/up phone call to [place] [facility] gave number - I will be happy to assist with referrals  ? ?Recommeneded reaching out to Dr. Holley Raring to ensure he is aware of pregnancy and I have explained to family that I do not know if that changes tx parameters on PD? ? ?Reviewed her BP trends - feel its important for her to be aware of her baseline and monitor for any increases. ? ?Did STD screening - has not been done yet in pregnancy - one female partner with no hx of STDs ? ? ? ? ? ? ?Delsa Grana, PA-C ?03/25/22 3:17 PM ? ?

## 2022-03-26 LAB — URINALYSIS, ROUTINE W REFLEX MICROSCOPIC
Bilirubin Urine: NEGATIVE
Glucose, UA: NEGATIVE
Hyaline Cast: NONE SEEN /LPF
Ketones, ur: NEGATIVE
Nitrite: POSITIVE — AB
RBC / HPF: NONE SEEN /HPF (ref 0–2)
Specific Gravity, Urine: 1.005 (ref 1.001–1.035)
pH: 8.5 — ABNORMAL HIGH (ref 5.0–8.0)

## 2022-03-26 LAB — URINE CULTURE
MICRO NUMBER:: 13260229
SPECIMEN QUALITY:: ADEQUATE

## 2022-03-26 LAB — RPR: RPR Ser Ql: NONREACTIVE

## 2022-03-26 LAB — MICROSCOPIC MESSAGE

## 2022-03-26 LAB — HIV ANTIBODY (ROUTINE TESTING W REFLEX): HIV 1&2 Ab, 4th Generation: NONREACTIVE

## 2022-03-29 LAB — CERVICOVAGINAL ANCILLARY ONLY
Bacterial Vaginitis (gardnerella): NEGATIVE
Candida Glabrata: NEGATIVE
Candida Vaginitis: NEGATIVE
Chlamydia: NEGATIVE
Comment: NEGATIVE
Comment: NEGATIVE
Comment: NEGATIVE
Comment: NEGATIVE
Comment: NEGATIVE
Comment: NORMAL
Neisseria Gonorrhea: NEGATIVE
Trichomonas: NEGATIVE

## 2022-06-14 LAB — HM HEPATITIS C SCREENING LAB: HM Hepatitis Screen: NEGATIVE

## 2022-12-09 ENCOUNTER — Encounter: Payer: Self-pay | Admitting: Family Medicine

## 2022-12-09 ENCOUNTER — Emergency Department
Admission: EM | Admit: 2022-12-09 | Discharge: 2022-12-09 | Discharge status: Against Medical Advice | Payer: BC Managed Care – PPO

## 2022-12-09 ENCOUNTER — Ambulatory Visit: Payer: Medicare PPO | Admitting: Family Medicine

## 2022-12-09 VITALS — BP 124/82 | HR 76 | Resp 16 | Ht 61.0 in | Wt 108.0 lb

## 2022-12-09 DIAGNOSIS — Z992 Dependence on renal dialysis: Secondary | ICD-10-CM

## 2022-12-09 DIAGNOSIS — N186 End stage renal disease: Secondary | ICD-10-CM

## 2022-12-09 DIAGNOSIS — Z09 Encounter for follow-up examination after completed treatment for conditions other than malignant neoplasm: Secondary | ICD-10-CM

## 2022-12-09 DIAGNOSIS — J069 Acute upper respiratory infection, unspecified: Secondary | ICD-10-CM | POA: Diagnosis not present

## 2022-12-09 DIAGNOSIS — R079 Chest pain, unspecified: Secondary | ICD-10-CM

## 2022-12-09 DIAGNOSIS — J4521 Mild intermittent asthma with (acute) exacerbation: Secondary | ICD-10-CM

## 2022-12-09 DIAGNOSIS — N390 Urinary tract infection, site not specified: Secondary | ICD-10-CM | POA: Diagnosis not present

## 2022-12-09 MED ORDER — PREDNISONE 20 MG PO TABS: 40.0000 mg | ORAL_TABLET | Freq: Every day | ORAL | 0 refills | Status: DC

## 2022-12-09 MED ORDER — ALBUTEROL SULFATE HFA 108 (90 BASE) MCG/ACT IN AERS: 2.0000 | INHALATION_SPRAY | RESPIRATORY_TRACT | 2 refills | Status: AC | PRN

## 2022-12-09 MED ORDER — ALBUTEROL SULFATE (2.5 MG/3ML) 0.083% IN NEBU
2.5000 mg | INHALATION_SOLUTION | Freq: Once | RESPIRATORY_TRACT | Status: AC
  Administered 2022-12-09: 2.5 mg via RESPIRATORY_TRACT

## 2022-12-09 MED ORDER — ALBUTEROL SULFATE (2.5 MG/3ML) 0.083% IN NEBU: 2.5000 mg | INHALATION_SOLUTION | Freq: Four times a day (QID) | RESPIRATORY_TRACT | 1 refills | Status: DC | PRN

## 2022-12-09 NOTE — Progress Notes (Signed)
Patient ID: Ashley Garrison, female    DOB: [birth year], 21 y.o.   MRN: [id number]  PCP: Delsa Grana, PA-C  Chief Complaint  Patient presents with   Shortness of Breath    X3 days   Chest Pain    X3 days    Subjective:   Ashley Garrison is a 21 y.o. 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   Anemia in chronic kidney disease 12/31/2019   ESRD on peritoneal dialysis ([place]) 12/27/2019   Hyperparathyroidism, 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     Social History   Tobacco Use   Smoking status: Never   Smokeless tobacco: Never  Vaping Use   Vaping Use: Never used  Substance Use Topics   Alcohol use: No   Drug use: Never      Chart Review Today: I personally reviewed active problem list, medication list, allergies, family history, social history, health maintenance, notes from last  encounter, lab results, imaging with the patient/caregiver today.   Review of Systems  Constitutional: Negative.   HENT: Negative.    Eyes: Negative.   Respiratory: Negative.    Cardiovascular: Negative.   Gastrointestinal: Negative.   Endocrine: Negative.   Genitourinary: Negative.   Musculoskeletal: Negative.   Skin: Negative.   Allergic/Immunologic: Negative.   Neurological: Negative.   Hematological: Negative.   Psychiatric/Behavioral: Negative.    All other systems reviewed and are negative.      Objective:   Vitals:   12/09/22 1040  BP: 124/82  Pulse: 76  Resp: 16  SpO2: 98%  Weight: 108 lb (49 kg)  Height: 5\' 1"  (1.549 m)    Body mass index is 20.41 kg/m.  Physical Exam Vitals and nursing note reviewed.  Constitutional:      General: She is not in acute distress.    Appearance: Normal appearance. She is well-developed. She is not ill-appearing, toxic-appearing or diaphoretic.     Comments: Thin nontoxic-appearing  HENT:     Head: Normocephalic and atraumatic.     Right Ear: External ear normal.     Left Ear: External ear normal.     Nose: Congestion present.     Mouth/Throat:     Mouth: Mucous membranes are moist.     Pharynx: Oropharynx is clear. Uvula midline. Posterior oropharyngeal erythema present. No oropharyngeal  exudate.  Eyes:     General: Lids are normal. No scleral icterus.       Right eye: No discharge.        Left eye: No discharge.     Conjunctiva/sclera: Conjunctivae normal.  Neck:     Trachea: Phonation normal. No tracheal deviation.  Cardiovascular:     Rate and Rhythm: Normal rate and regular rhythm.     Pulses: Normal pulses.          Radial pulses are 2+ on the right side and 2+ on the left side.       Posterior tibial pulses are 2+ on the right side and 2+ on the left side.     Heart sounds: Normal heart sounds. No murmur heard.    No friction rub. No gallop.  Pulmonary:     Effort: Pulmonary effort is normal. No respiratory  distress.     Breath sounds: No stridor. Wheezing present. No rhonchi or rales.  Chest:     Chest wall: No tenderness.  Abdominal:     General: Bowel sounds are normal. There is no distension.     Palpations: Abdomen is soft.     Tenderness: There is no abdominal tenderness. There is no right CVA tenderness, left CVA tenderness, guarding or rebound.  Musculoskeletal:     Cervical back: Normal range of motion and neck supple.  Lymphadenopathy:     Cervical: No cervical adenopathy.  Skin:    General: Skin is warm and dry.     Capillary Refill: Capillary refill takes less than 2 seconds.     Coloration: Skin is not pale.     Findings: No rash.  Neurological:     Mental Status: She is alert.     Motor: No abnormal muscle tone.     Gait: Gait normal.  Psychiatric:        Mood and Affect: Mood normal.        Speech: Speech normal.        Behavior: Behavior normal.      Results for orders placed or performed in visit on 03/25/22  Urine Culture   Specimen: Urine  Result Value Ref Range   MICRO NUMBER: [id number]    SPECIMEN QUALITY: Adequate    Sample Source URINE    STATUS: FINAL    ISOLATE 1:      Mixed genital flora isolated. These superficial bacteria are not indicative of a urinary tract infection. No further organism identification is warranted on this specimen. If clinically indicated, recollect clean-catch, mid-stream urine and transfer  immediately to Urine Culture Transport Tube.   MICROSCOPIC MESSAGE  Result Value Ref Range   Note    HIV Antibody (routine testing w rflx)  Result Value Ref Range   HIV 1&2 Ab, 4th Generation NON-REACTIVE NON-REACTIVE  RPR  Result Value Ref Range   RPR Ser Ql NON-REACTIVE NON-REACTIVE  Urinalysis, Routine w reflex microscopic  Result Value Ref Range   Color, Urine YELLOW YELLOW   APPearance TURBID (A) CLEAR   Specific Gravity, Urine 1.005 1.001 - 1.035   pH 8.5 (H) 5.0 - 8.0   Glucose, UA NEGATIVE NEGATIVE   Bilirubin Urine  NEGATIVE NEGATIVE   Ketones, ur NEGATIVE NEGATIVE   Hgb urine dipstick 1+ (A) NEGATIVE   Protein, ur 1+ (A) NEGATIVE   Nitrite POSITIVE (A) NEGATIVE   Leukocytes,Ua 3+ (A) NEGATIVE   WBC, UA 10-20 (A) 0 - 5 /HPF   RBC / HPF NONE SEEN  0 - 2 /HPF   Squamous Epithelial / LPF 6-10 (A) < OR = 5 /HPF   Bacteria, UA MANY (A) NONE SEEN /HPF   Hyaline Cast NONE SEEN NONE SEEN /LPF  Cervicovaginal ancillary only  Result Value Ref Range   Neisseria Gonorrhea Negative    Chlamydia Negative    Trichomonas Negative    Bacterial Vaginitis (gardnerella) Negative    Candida Vaginitis Negative    Candida Glabrata Negative    Comment      Normal Reference Range Bacterial Vaginosis - Negative   Comment Normal Reference Range Candida Species - Negative    Comment Normal Reference Range Candida Galbrata - Negative    Comment Normal Reference Range Trichomonas - Negative    Comment Normal Reference [place] Chlamydia - Negative    Comment      Normal Reference Range Neisseria Gonorrhea - Negative       Assessment & Plan:     ICD-10-CM   1. Mild intermittent asthma with exacerbation  J45.21 predniSONE (DELTASONE) 20 MG tablet    albuterol (VENTOLIN HFA) 108 (90 Base) MCG/ACT inhaler    albuterol (PROVENTIL) (2.5 MG/3ML) 0.083% nebulizer solution 2.5 mg    DISCONTINUED: albuterol (PROVENTIL) (2.5 MG/3ML) 0.083% nebulizer solution   Wheeze with URI, given nebulizer treatment in office -see below    2. Upper respiratory tract infection, unspecified type  J06.9    tx with antihistamines, over-the-counter cough medicines (what is safe with kidney function), rest, push fluids    3. ESRD on peritoneal dialysis ([place])  N18.6 cephALEXin (KEFLEX) 500 MG capsule   Z99.2    renal dosing on meds reviewed and recent abx rx adjusted, recent specialist office visit labs and recent ER visit reviewed as well    4. Recurrent UTI  N39.0 cephALEXin (KEFLEX) 500 MG capsule   She has not started antibiotic treatment  symptoms are stable no NV flank pain or abdominal pain, reviewed renal function and meds keflex 500 BID x 5 d    5. Encounter for examination following treatment at hospital  Z09 cephALEXin (KEFLEX) 500 MG capsule   Hospital/ER encounter thoroughly reviewed, labs visit and medications all reviewed and adjusted today  -more than 10+ min chart review and documentation    6. Chest pain, unspecified type  R07.9    did improve with neb tx in office     Patient was instructed to sit and wait for reevaluation prior to leaving the clinic but she left prior to this she did report to [place] that her chest tightness and chest pain improved significantly with the breathing treatment so we did proceed with treatment for a asthma exacerbation as well as URI with a burst of steroids which we reviewed with her renal dosing is appropriate as above, also an inhaler was called in for her she has not had 1 in a while  Patient was previously notified that if any chest tightness shortness of breath or chest pain did not resolve or worsen she would need to follow-up immediately       Delsa Grana, PA-C 12/09/22 11:05 AM

## 2022-12-17 ENCOUNTER — Encounter: Payer: Self-pay | Admitting: Family Medicine

## 2022-12-20 NOTE — Progress Notes (Signed)
LVM asking pt to return call to schedule Return in about 3 months (around 03/10/2023) for Routine follow-up and a Medicare Wellness Visit

## 2023-05-17 DIAGNOSIS — D8481 Immunodeficiency due to conditions classified elsewhere: Secondary | ICD-10-CM | POA: Insufficient documentation

## 2023-06-28 ENCOUNTER — Ambulatory Visit (INDEPENDENT_AMBULATORY_CARE_PROVIDER_SITE_OTHER): Payer: Medicare PPO | Admitting: Family Medicine

## 2023-06-28 ENCOUNTER — Encounter: Payer: Self-pay | Admitting: Family Medicine

## 2023-06-28 VITALS — BP 110/68 | HR 99 | Temp 98.9°F | Resp 16 | Ht 61.0 in | Wt 96.7 lb

## 2023-06-28 DIAGNOSIS — J020 Streptococcal pharyngitis: Secondary | ICD-10-CM

## 2023-06-28 LAB — POCT RAPID STREP A (OFFICE): Rapid Strep A Screen: POSITIVE — AB

## 2023-06-28 MED ORDER — AMOXICILLIN 500 MG PO CAPS
500.0000 mg | ORAL_CAPSULE | Freq: Two times a day (BID) | ORAL | 0 refills | Status: DC
Start: 2023-06-28 — End: 2023-09-01

## 2023-06-28 NOTE — Progress Notes (Signed)
Patient ID: Ashley Garrison, female    DOB: Dec 23, 2000, 22 y.o.   MRN: [id number]  PCP: Danelle Berry, PA-C  Chief Complaint  Patient presents with   Generalized Body Aches   Chills   Sore Throat    Sx started last night   Fever    Subjective:   Ashley Garrison is a 22 y.o. 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   Anemia in chronic kidney disease 12/31/2019   ESRD on peritoneal dialysis ([facility]) 12/27/2019   Hyperparathyroidism, secondary renal ([facility]) 12/27/2019   Vitamin D deficiency disease 12/27/2019   Hydronephrosis 12/27/2019   Lower urinary tract infectious disease 11/22/2013   Recurrent UTI 11/17/2013      Current Outpatient Medications:    albuterol (VENTOLIN HFA) 108 (90 Base) MCG/ACT inhaler, Inhale 2 puffs into the lungs every 4 (four) hours as needed for wheezing or shortness of breath., Disp: 18 g, Rfl: 2   amoxicillin (AMOXIL) 500 MG capsule, Take 1 capsule (500 mg total) by mouth 2 (two) times daily for 10 days., Disp: 20 capsule, Rfl: 0   gentamicin cream (GARAMYCIN) 0.1 %, Apply 1 Application topically daily., Disp: , Rfl:    cinacalcet (SENSIPAR) 90 MG tablet, Take 90 mg by mouth daily. (Patient not taking: Reported on 06/28/2023), Disp: , Rfl:    predniSONE (DELTASONE) 20 MG tablet, Take 2 tablets (40 mg total) by mouth daily. (Patient not taking: Reported on 06/28/2023), Disp: 10 tablet, Rfl: 0   Allergies  Allergen Reactions   Other     fruit Other reaction(s): Other (See  Comments) Angioedema; undiagnosed as to what.     Social History   Tobacco Use   Smoking status: Never   Smokeless tobacco: Never  Vaping Use   Vaping status: Never Used  Substance Use Topics   Alcohol use: No   Drug use: Never      Chart Review Today: I personally reviewed active problem list, medication list, allergies, family history, social history, health maintenance, notes from last encounter, lab results, imaging with the patient/caregiver today.   Review of Systems  Constitutional:  Positive for fever.  HENT: Negative.    Eyes: Negative.   Respiratory: Negative.    Cardiovascular: Negative.   Gastrointestinal: Negative.   Endocrine: Negative.   Genitourinary: Negative.   Musculoskeletal: Negative.   Skin: Negative.   Allergic/Immunologic: Negative.   Neurological: Negative.   Hematological: Negative.   Psychiatric/Behavioral: Negative.    All other systems reviewed and are negative.      Objective:   Vitals:   06/28/23 1335  BP: 110/68  Pulse: 99  Resp: 16  Temp: 98.9 F (37.2 C)  TempSrc: Oral  SpO2: 99%  Weight: 96 lb 11.2 oz (43.9 kg)  Height: 5\' 1"  (1.549 m)    Body mass index is 18.27 kg/m.  Physical Exam Vitals and nursing note reviewed.  Constitutional:      General: She is not in acute  distress.    Appearance: She is well-developed. She is not ill-appearing, toxic-appearing or diaphoretic.  HENT:     Head: Normocephalic and atraumatic.     Right Ear: External ear normal.     Left Ear: External ear normal.     Nose: Nose normal.     Mouth/Throat:     Mouth: Mucous membranes are moist.     Pharynx: Oropharynx is clear. Uvula midline. Posterior oropharyngeal erythema present. No pharyngeal swelling.     Tonsils: No tonsillar exudate.  Eyes:     General: No scleral icterus.       Right eye: No discharge.        Left eye: No discharge.     Conjunctiva/sclera: Conjunctivae normal.  Neck:     Trachea: No tracheal deviation.   Cardiovascular:     Rate and Rhythm: Normal rate and regular rhythm.     Pulses: Normal pulses.  Pulmonary:     Effort: Pulmonary effort is normal. No respiratory distress.     Breath sounds: Normal breath sounds. No stridor.  Musculoskeletal:        General: Normal range of motion.  Lymphadenopathy:     Cervical: Cervical adenopathy present.  Skin:    General: Skin is warm and dry.     Findings: No rash.  Neurological:     Mental Status: She is alert.     Motor: No abnormal muscle tone.     Coordination: Coordination normal.  Psychiatric:        Behavior: Behavior normal.      Results for orders placed or performed in visit on 06/28/23  POCT rapid strep A  Result Value Ref Range   Rapid Strep A Screen Positive (A) Negative       Assessment & Plan:   1. Strep pharyngitis Pt given work note, encouraged to rest, push fluids, can do tylenol for fever/aches/pains, start abx, get new tooth brush 24-48 h after starting abx, should not be contagious after 24 hours, work note to excuse for the next 2 work days and to return on Friday - f/up if not improving, recommended she complete entire 10 d abx due to her other health hx. - POCT rapid strep A - amoxicillin (AMOXIL) 500 MG capsule; Take 1 capsule (500 mg total) by mouth 2 (two) times daily for 10 days.  Dispense: 20 capsule; Refill: 0   follow up if not improving   Danelle Berry, PA-C 06/28/23 2:16 PM

## 2023-06-28 NOTE — Patient Instructions (Signed)
Strep Throat, Adult Strep throat is an infection of the throat. It is caused by germs (bacteria). Strep throat is common during the cold months of the year. It mostly affects children who are 33-23 years old. However, people of all ages can get it at any time of the year. This infection spreads from person to person through coughing, sneezing, or having close contact. What are the causes? This condition is caused by the Streptococcus pyogenes germ. What increases the risk? You care for young children. Children are more likely to get strep throat and may spread it to others. You go to crowded places. Germs can spread easily in such places. You kiss or touch someone who has strep throat. What are the signs or symptoms? Fever or chills. Redness, swelling, or pain in the tonsils or throat. Pain or trouble when swallowing. White or yellow spots on the tonsils or throat. Tender glands in the neck and under the jaw. Bad breath. Red rash all over the body. This is rare. How is this treated? Medicines that kill germs (antibiotics). Medicines that treat pain or fever. These include: Ibuprofen or acetaminophen. Aspirin, only for people who are over the age of 57. Cough drops. Throat sprays. Follow these instructions at home: Medicines  Take over-the-counter and prescription medicines only as told by your doctor. Take your antibiotic medicine as told by your doctor. Do not stop taking the antibiotic even if you start to feel better. Eating and drinking  If you have trouble swallowing, eat soft foods until your throat feels better. Drink enough fluid to keep your pee (urine) pale yellow. To help with pain, you may have: Warm fluids, such as soup and tea. Cold fluids, such as frozen desserts or popsicles. General instructions Rinse your mouth (gargle) with a salt-water mixture 3-4 times a day or as needed. To make a salt-water mixture, dissolve -1 tsp (3-6 g) of salt in 1 cup (237 mL) of warm  water. Rest as much as you can. Stay home from work or school until you have been taking antibiotics for 24 hours. Do not smoke or use any products that contain nicotine or tobacco. If you need help quitting, ask your doctor. Keep all follow-up visits. How is this prevented?  Do not share food, drinking cups, or personal items. They can cause the germs to spread. Wash your hands well with soap and water. Make sure that all people in your house wash their hands well. Have family members tested if they have a fever or a sore throat. They may need an antibiotic if they have strep throat. Contact a doctor if: You have swelling in your neck that keeps getting bigger. You get a rash, cough, or earache. You cough up a thick fluid that is green, yellow-brown, or bloody. You have pain that does not get better with medicine. Your symptoms get worse instead of getting better. You have a fever. Get help right away if: You vomit. You have a very bad headache. Your neck hurts or feels stiff. You have chest pain or are short of breath. You have drooling, very bad throat pain, or changes in your voice. Your neck is swollen, or the skin gets red and tender. Your mouth is dry, or you are peeing less than normal. You keep feeling more tired or have trouble waking up. Your joints are red or painful. These symptoms may be an emergency. Do not wait to see if the symptoms will go away. Get help right away. Call  your local emergency services (911 in the [place] Summary Strep throat is an infection of the throat. It is caused by germs (bacteria). This infection can spread from person to person through coughing, sneezing, or having close contact. Take your medicines, including antibiotics, as told by your doctor. Do not stop taking the antibiotic even if you start to feel better. To prevent the spread of germs, wash your hands well with soap and water. Have others do the same. Do not share food, drinking cups,  or personal items. Get help right away if you have a bad headache, chest pain, shortness of breath, a stiff or painful neck, or you vomit. This information is not intended to replace advice given to you by your health care provider. Make sure you discuss any questions you have with your health care provider. Document Revised: 03/24/2021 Document Reviewed: 03/24/2021 Elsevier Patient Education  2024 [employer].

## 2023-08-29 ENCOUNTER — Ambulatory Visit (INDEPENDENT_AMBULATORY_CARE_PROVIDER_SITE_OTHER): Payer: Medicare PPO | Admitting: Family Medicine

## 2023-08-29 ENCOUNTER — Encounter: Payer: Self-pay | Admitting: Family Medicine

## 2023-08-29 VITALS — BP 94/62 | HR 91 | Temp 100.4°F | Resp 16 | Ht 61.0 in | Wt 92.6 lb

## 2023-08-29 DIAGNOSIS — R519 Headache, unspecified: Secondary | ICD-10-CM | POA: Diagnosis not present

## 2023-08-29 DIAGNOSIS — R52 Pain, unspecified: Secondary | ICD-10-CM

## 2023-08-29 DIAGNOSIS — B349 Viral infection, unspecified: Secondary | ICD-10-CM

## 2023-08-29 DIAGNOSIS — J069 Acute upper respiratory infection, unspecified: Secondary | ICD-10-CM | POA: Diagnosis not present

## 2023-08-29 DIAGNOSIS — R1111 Vomiting without nausea: Secondary | ICD-10-CM

## 2023-08-29 DIAGNOSIS — Z992 Dependence on renal dialysis: Secondary | ICD-10-CM

## 2023-08-29 DIAGNOSIS — N186 End stage renal disease: Secondary | ICD-10-CM

## 2023-08-29 LAB — POCT RAPID STREP A (OFFICE): Rapid Strep A Screen: POSITIVE — AB

## 2023-08-29 LAB — POCT INFLUENZA A/B
Influenza A, POC: NEGATIVE
Influenza B, POC: NEGATIVE

## 2023-08-29 MED ORDER — ONDANSETRON 4 MG PO TBDP: 4.0000 mg | ORAL_TABLET | Freq: Three times a day (TID) | ORAL | 0 refills | Status: DC | PRN

## 2023-08-29 NOTE — Progress Notes (Signed)
Patient ID: Ashley Garrison, female    DOB: Apr 02, 2001, 22 y.o.   MRN: [id number]  PCP: Danelle Berry, PA-C  Chief Complaint  Patient presents with   Emesis   Headache   Generalized Body Aches    Sx started last night    Subjective:   Ashley Garrison is a 22 y.o. 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   Anemia in chronic kidney disease 12/31/2019   ESRD on peritoneal dialysis (HCC) 12/27/2019   Hyperparathyroidism, 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 (Patient not taking: Reported on 06/28/2023), Disp: , Rfl:    predniSONE (DELTASONE) 20 MG tablet, Take 2 tablets (40 mg total) by mouth daily., Disp: 10 tablet, Rfl: 0   Allergies  Allergen Reactions   Other     fruit Other reaction(s): Other (See Comments) Angioedema; undiagnosed as to what.     Social History   Tobacco Use   Smoking status: Never   Smokeless tobacco: Never  Vaping Use   Vaping status: Never Used   Substance Use Topics   Alcohol use: No   Drug use: Never      Chart Review Today: I personally reviewed active problem list, medication list, allergies, family history, social history, health maintenance, notes from last encounter, lab results, imaging with the patient/caregiver today.   Review of Systems  Constitutional: Negative.   HENT: Negative.    Eyes: Negative.   Respiratory: Negative.    Cardiovascular: Negative.   Gastrointestinal: Negative.   Endocrine: Negative.   Genitourinary: Negative.   Musculoskeletal: Negative.   Skin: Negative.   Allergic/Immunologic: Negative.   Neurological: Negative.   Hematological: Negative.   Psychiatric/Behavioral: Negative.    All other systems reviewed and are negative.      Objective:   Vitals:   08/29/23 1340 08/29/23 1345  BP: 94/62   Pulse: (!) 104 91  Resp: 16   Temp: (!) 100.4 F (38 C)   TempSrc: Oral   SpO2: 96%   Weight: 92 lb 9.6 oz (42 kg)   Height: 5\' 1"  (1.549 m)     Body mass index is 17.5 kg/m.  Physical Exam Vitals and nursing note reviewed.  Constitutional:      General: She is not in acute distress.    Appearance: Normal appearance. She is well-developed. She is not ill-appearing, toxic-appearing or diaphoretic.  HENT:     Head: Normocephalic  and atraumatic.     Right Ear: External ear normal.     Left Ear: External ear normal.     Nose: Congestion and rhinorrhea present.     Mouth/Throat:     Mouth: Mucous membranes are moist.     Pharynx: Posterior oropharyngeal erythema present. No oropharyngeal exudate.  Eyes:     General: No scleral icterus.       Right eye: No discharge.        Left eye: No discharge.     Conjunctiva/sclera: Conjunctivae normal.  Neck:     Trachea: No tracheal deviation.  Cardiovascular:     Rate and Rhythm: Regular rhythm. Tachycardia present.     Pulses: Normal pulses.     Heart sounds: Normal heart sounds. No murmur heard.    No friction rub. No gallop.   Pulmonary:     Effort: Pulmonary effort is normal. No respiratory distress.     Breath sounds: Normal breath sounds. No stridor. No wheezing, rhonchi or rales.  Skin:    Findings: No rash.  Neurological:     Mental Status: She is alert.     Motor: No abnormal muscle tone.     Coordination: Coordination normal.  Psychiatric:        Mood and Affect: Mood normal.        Behavior: Behavior normal.      Results for orders placed or performed in visit on 06/28/23  POCT rapid strep A  Result Value Ref Range   Rapid Strep A Screen Positive (A) Negative       Assessment & Plan:   Pt is a 22 y/o female with hx of ESRD maintained on PD, presents with 2-3 days mild URI sx, she woke up today with fatigue, myalgias, HA, and then vomited at work.  Nasal drainage is clear, no sore throat. No sick contacts  She did test positive for strep in office today - concern for her being possibly a strep carrier?  Sx are not similar to last strep throat - will consider tx for that Pending home COVID test Rapid flu is negative here     ICD-10-CM   1. Body aches  R52 POCT rapid strep A    POCT Influenza A/B    2. Nonspecific syndrome suggestive of viral illness  B34.9 POCT rapid strep A    POCT Influenza A/B   nasal sx x 2 d with fever, body aches, fatigue N/V/D onset today    3. Upper respiratory tract infection, unspecified type  J06.9 POCT rapid strep A    POCT Influenza A/B    4. Vomiting without nausea, unspecified vomiting type  R11.11 POCT rapid strep A    POCT Influenza A/B    ondansetron (ZOFRAN-ODT) 4 MG disintegrating tablet   checked renal/PD dosing, can use zofran    5. Acute intractable headache, unspecified headache type  R51.9 POCT rapid strep A    POCT Influenza A/B    6. ESRD on peritoneal dialysis [facility]) Chronic N18.6    Z99.2   If needed paxlovid dosing with PD is modified - will review with pt if COVID + If covid neg will tx only with abx per PD dosing for strep     Work note given Will wait for pt to do home COVID test and we will discuss tx plan after that    Danelle Berry, PA-C 08/29/23 2:02 PM

## 2023-09-01 ENCOUNTER — Other Ambulatory Visit: Payer: Self-pay | Admitting: Family Medicine

## 2023-09-01 ENCOUNTER — Encounter: Payer: Self-pay | Admitting: Family Medicine

## 2023-09-01 DIAGNOSIS — J020 Streptococcal pharyngitis: Secondary | ICD-10-CM

## 2023-09-01 MED ORDER — AMOXICILLIN 500 MG PO CAPS
500.0000 mg | ORAL_CAPSULE | Freq: Two times a day (BID) | ORAL | 0 refills | Status: AC
Start: 2023-09-01 — End: 2023-09-11

## 2024-09-17 ENCOUNTER — Ambulatory Visit: Payer: Self-pay

## 2024-09-17 NOTE — Telephone Encounter (Signed)
 possible UTI- frequency and burning- [phone]   Reason for Disposition  Urinating more frequently than usual (i.e., frequency) OR new-onset of the feeling of an urgent need to urinate (i.e., urgency)  Answer Assessment - Initial Assessment Questions 1. SYMPTOM: What's the main symptom you're concerned about? (e.g., frequency, incontinence)     Burning  2. ONSET: When did the    start?     2 days ago  3. PAIN: Is there any pain? If Yes, ask: How bad is it? (Scale: 1-10; mild, moderate, severe)     denies 4. CAUSE: What do you think is causing the symptoms?     Possible UTI 5. OTHER SYMPTOMS: Do you have any other symptoms? (e.g., blood in urine, fever, flank pain, pain with urination) Frequency  Protocols used: Urinary Symptoms-A-AH

## 2024-09-17 NOTE — Telephone Encounter (Signed)
 FYI Only or Action Required?: Action required by provider: request for appointment.  Patient was last seen in primary care on 08/29/2023 by Leavy Mole, PA-C.  Called Nurse Triage reporting Urinary Tract Infection.  Symptoms began several days ago.  Interventions attempted: Nothing.  Symptoms are: unchanged.  Triage Disposition: See Physician Within 24 Hours  Patient/caregiver understands and will follow disposition?: Yes

## 2024-09-18 ENCOUNTER — Ambulatory Visit (INDEPENDENT_AMBULATORY_CARE_PROVIDER_SITE_OTHER): Admitting: Family Medicine

## 2024-09-18 ENCOUNTER — Encounter: Payer: Self-pay | Admitting: Family Medicine

## 2024-09-18 VITALS — BP 122/74 | HR 94 | Resp 16 | Ht 61.0 in | Wt 96.5 lb

## 2024-09-18 DIAGNOSIS — N185 Chronic kidney disease, stage 5: Secondary | ICD-10-CM

## 2024-09-18 DIAGNOSIS — N390 Urinary tract infection, site not specified: Secondary | ICD-10-CM

## 2024-09-18 DIAGNOSIS — Z992 Dependence on renal dialysis: Secondary | ICD-10-CM | POA: Insufficient documentation

## 2024-09-18 DIAGNOSIS — N2581 Secondary hyperparathyroidism of renal origin: Secondary | ICD-10-CM | POA: Diagnosis not present

## 2024-09-18 DIAGNOSIS — R300 Dysuria: Secondary | ICD-10-CM

## 2024-09-18 DIAGNOSIS — D631 Anemia in chronic kidney disease: Secondary | ICD-10-CM

## 2024-09-18 LAB — POCT URINALYSIS DIPSTICK
Bilirubin, UA: NEGATIVE
Glucose, UA: NEGATIVE
Ketones, UA: NEGATIVE
Nitrite, UA: NEGATIVE
Protein, UA: POSITIVE — AB
Spec Grav, UA: <=1.005 — AB (ref 1.010–1.025)
Urobilinogen, UA: 0.2 U/dL
pH, UA: 7.5 (ref 5.0–8.0)

## 2024-09-18 MED ORDER — AMOXICILLIN-POT CLAVULANATE 250-125 MG PO TABS
1.0000 | ORAL_TABLET | Freq: Every day | ORAL | 0 refills | Status: DC
Start: 2024-09-18 — End: 2024-10-15

## 2024-09-18 NOTE — Progress Notes (Signed)
 Name: Ashley Garrison   MRN: [id number]    DOB: [birth year]   Date:09/18/2024       Progress Note  Subjective  Chief Complaint  Chief Complaint  Patient presents with   Dysuria    X3 days    Discussed the use of AI scribe software for clinical note transcription with the patient, who gave verbal consent to proceed.  History of Present Illness Ashley Garrison is a 23 year old female with end stage renal disease and recurrent UTIs who presents with urinary symptoms.  She has been experiencing urinary symptoms for the past three days, including dysuria and urinary frequency with minimal output. Last night, she developed lower abdominal pain.  Her medical history includes end stage renal disease, for which she is not currently on dialysis, although she has a peritoneal dialysis catheter in place. She was previously on both peritoneal and hemodialysis, particularly during and after her pregnancies. Her last nephrology follow-up was in November of the previous year, and her kidney function was last checked at that time; GFR was 9  She has a history of recurrent urinary tract infections, with the last severe episode requiring hospitalization. She has not experienced a UTI in a while until the current symptoms began.  Her current medications include calcitriol, Sensipar, and gentamicin cream for her catheter. She uses albuterol  for seasonal asthma and takes nausea medication as needed. She previously took prednisone  but is no longer on it.  No fever, chills, nausea, vomiting, changes in appetite, or bowel movements. She is not sexually active and reports no allergies to medications.  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    Social History   Tobacco Use   Smoking status: Never   Smokeless tobacco: Never  Substance Use Topics   Alcohol use: No     Current Outpatient Medications:    albuterol  (VENTOLIN  HFA) 108 (90 Base) MCG/ACT inhaler, Inhale 2 puffs into the lungs every 4 (four) hours as needed for wheezing or shortness of breath., Disp: 18 g, Rfl: 2   amoxicillin -clavulanate (AUGMENTIN) 250-125 MG tablet, Take 1 tablet by mouth daily., Disp: 7 tablet, Rfl: 0   cinacalcet (SENSIPAR) 90 MG tablet, Take 90 mg by mouth daily., Disp: , Rfl:    gentamicin cream (GARAMYCIN) 0.1 %, Apply 1 Application topically daily., Disp: , Rfl:    ondansetron  (ZOFRAN -ODT) 4 MG disintegrating tablet, Take 1 tablet (4 mg total) by mouth every 8 (eight) hours as needed for nausea or vomiting., Disp: 20 tablet, Rfl: 0  Allergies  Allergen Reactions   Fruit Extracts Anaphylaxis    Any fruit   Other     fruit Other reaction(s): Other (See Comments) Angioedema; undiagnosed as to what.    ROS  Ten systems reviewed and is negative except as mentioned in HPI    Objective  Vitals:   09/18/24 1338  BP: 122/74  Pulse: 94  Resp: 16  SpO2: 95%  Weight: 96 lb 8 oz (43.8 kg)  Height: 5' 1 (1.549 m)    Body mass index is 18.23 kg/m.  Physical Exam CONSTITUTIONAL: Patient appears well-developed and well-nourished. No distress. HEENT: Head atraumatic,  normocephalic, neck supple. CARDIOVASCULAR: Normal rate, regular rhythm and normal heart sounds. No murmur heard. No BLE edema. PULMONARY: Effort normal and breath sounds normal. No respiratory distress. ABDOMINAL: Abdomen mild tenderness with deep palpation  MUSCULOSKELETAL: Normal gait. Without gross motor or sensory deficit. PSYCHIATRIC: Patient has a normal mood and affect. Behavior is normal. Judgment and thought content normal.  Recent Results (from the past 2160 hours)   POCT urinalysis dipstick     Status: Abnormal   Collection Time: 09/18/24  1:38 PM  Result Value Ref Range   Color, UA Straw    Clarity, UA Hazy    Glucose, UA Negative Negative   Bilirubin, UA Negative    Ketones, UA Negative    Spec Grav, UA <=1.005 (A) 1.010 - 1.025   Blood, UA Large    pH, UA 7.5 5.0 - 8.0   Protein, UA Positive (A) Negative   Urobilinogen, UA 0.2 0.2 or 1.0 E.U./dL   Nitrite, UA Negative    Leukocytes, UA Large (3+) (A) Negative   Appearance straw    Odor Sour     Assessment & Plan Urinary tract infection with dysuria and recurrent urinary tract infections Urinary tract infection with dysuria. Limited treatment options due to chronic kidney disease. Declined Rocephin  shot despite recommendation for aggressive treatment. - Administer Augmentin 250 mg/150 mg once daily for 7 days based on last year's kidney function. - Encourage reconsideration of Rocephin  shot for immediate treatment due to uncertainty of current kidney function. - Perform blood work to assess current kidney function and adjust treatment as necessary.  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 Unknown current kidney function. No recent nephrology follow-up. - Order blood work including CBC, kidney function tests, vitamin D , and parathyroid hormone levels. - Contact nephrologist Dr. Jonah  to arrange follow-up appointment and discuss current management plan.   General Health Maintenance Declined flu shot. Decline Rocephin  shot even though explain that was the best option since we don't know her current GFR

## 2024-09-20 ENCOUNTER — Ambulatory Visit: Payer: Self-pay | Admitting: Family Medicine

## 2024-09-20 LAB — URINE CULTURE
MICRO NUMBER:: 17068683
SPECIMEN QUALITY:: ADEQUATE

## 2024-10-15 ENCOUNTER — Ambulatory Visit (INDEPENDENT_AMBULATORY_CARE_PROVIDER_SITE_OTHER): Admitting: Family Medicine

## 2024-10-15 ENCOUNTER — Encounter: Payer: Self-pay | Admitting: Family Medicine

## 2024-10-15 ENCOUNTER — Other Ambulatory Visit (HOSPITAL_COMMUNITY)
Admission: RE | Admit: 2024-10-15 | Discharge: 2024-10-15 | Disposition: A | Discharge status: Home / Self Care | Source: Ambulatory Visit | Attending: Family Medicine | Admitting: Family Medicine

## 2024-10-15 VITALS — BP 100/62 | HR 81 | Resp 16 | Ht 61.0 in | Wt 99.7 lb

## 2024-10-15 DIAGNOSIS — N185 Chronic kidney disease, stage 5: Secondary | ICD-10-CM

## 2024-10-15 DIAGNOSIS — Z1151 Encounter for screening for human papillomavirus (HPV): Secondary | ICD-10-CM | POA: Diagnosis not present

## 2024-10-15 DIAGNOSIS — Z13220 Encounter for screening for lipoid disorders: Secondary | ICD-10-CM | POA: Diagnosis not present

## 2024-10-15 DIAGNOSIS — Z131 Encounter for screening for diabetes mellitus: Secondary | ICD-10-CM | POA: Diagnosis not present

## 2024-10-15 DIAGNOSIS — R102 Pelvic and perineal pain unspecified side: Secondary | ICD-10-CM | POA: Insufficient documentation

## 2024-10-15 DIAGNOSIS — Z01419 Encounter for gynecological examination (general) (routine) without abnormal findings: Secondary | ICD-10-CM | POA: Diagnosis present

## 2024-10-15 DIAGNOSIS — Z113 Encounter for screening for infections with a predominantly sexual mode of transmission: Secondary | ICD-10-CM | POA: Diagnosis not present

## 2024-10-15 NOTE — Progress Notes (Signed)
 Name: Ashley Garrison   MRN: [id number]    DOB: Nov 29, 2001   Date:10/15/2024       Progress Note  Subjective  Chief Complaint  Chief Complaint  Patient presents with   Annual Exam    Pt declined PAP due to not being PCP    HPI  Patient presents for annual CPE.  Diet: cooks at home Exercise: discussed importance of regular physical activity  Last Eye Exam: completed Last Dental Exam: completed  Flowsheet Row Office Visit from 10/15/2024 in [facility]  AUDIT-C Score 0   Depression: Phq 9 is  negative    10/15/2024    3:09 PM 09/18/2024    1:28 PM 08/29/2023    1:40 PM 06/28/2023    1:35 PM 12/09/2022   10:39 AM  Depression screen PHQ 2/9  Decreased Interest 0 0 0 0 0  Down, Depressed, Hopeless 0 0 0 0 0  PHQ - 2 Score 0 0 0 0 0  Altered sleeping   0 0 0  Tired, decreased energy   0 0 0  Change in appetite   0 0 0  Feeling bad or failure about yourself    0 0 0  Trouble concentrating   0 0 0  Moving slowly or fidgety/restless   0 0 0  Suicidal thoughts   0 0 0  PHQ-9 Score   0 0 0  Difficult doing work/chores   Not difficult at all Not difficult at all    Hypertension: BP Readings from Last 3 Encounters:  10/15/24 100/62  09/18/24 122/74  08/29/23 94/62   Obesity: Wt Readings from Last 3 Encounters:  10/15/24 99 lb 11.2 oz (45.2 kg)  09/18/24 96 lb 8 oz (43.8 kg)  08/29/23 92 lb 9.6 oz (42 kg)   BMI Readings from Last 3 Encounters:  10/15/24 18.84 kg/m  09/18/24 18.23 kg/m  08/29/23 17.50 kg/m     Vaccines: reviewed with the patient. Flu shot today, PCV 20 in one month   Hep C Screening: completed STD testing and prevention (HIV/chl/gon/syphilis): today  Intimate partner violence: negative screen  Sexual History : last intercourse 2 weeks Menstrual History/LMP/Abnormal Bleeding: cycles are regular , uses condoms. Discussed contraception but she is not ready  Discussed importance of follow up if any post-menopausal bleeding: not  applicable  Incontinence Symptoms: negative for symptoms   Breast cancer:  - Last Mammogram: N/A - BRCA gene screening: maternal aunt had breast cancer in a young age, she is not sure if she got genetically test   Osteoporosis Prevention : Discussed high calcium and vitamin D  supplementation, weight bearing exercises Bone density :not applicable   Cervical cancer screening: patient declined today but is willing to have it done next month   Skin cancer: Discussed monitoring for atypical lesions  Colorectal cancer: N/A   Lung cancer:  Low Dose CT Chest recommended if Age 61-80 years, 20 pack-year currently smoking OR have quit w/in 15years. Patient does not qualify for screen   ECG: done at [facility] 2023  Advanced Care Planning: A voluntary discussion about advance care planning including the explanation and discussion of advance directives.  Discussed health care proxy and Living will, and the patient was able to identify a health care proxy as mother .  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 No pertinent family history.  Social History   Socioeconomic History   Marital status: Single    Spouse name: Not on file   Number of children: Not on file   Years of education: Not on file   Highest education level: Not on file  Occupational History    Comment: [employer]  Tobacco Use   Smoking status: Never   Smokeless tobacco: Never  Vaping Use   Vaping status: Never Used  Substance and  Sexual Activity   Alcohol use: No   Drug use: Never   Sexual activity: Yes  Other Topics Concern   Not on file  Social History Narrative   Not on file   Social Drivers of Health   Financial Resource Strain: Low Risk  (10/15/2024)   Overall Financial Resource Strain (CARDIA)    Difficulty of Paying Living Expenses: Not hard at all  Food Insecurity: No Food Insecurity (10/15/2024)   Hunger Vital Sign    Worried About Running Out of Food in the Last Year: Never true    Ran Out of Food in the Last Year: Never true  Transportation Needs: No Transportation Needs (10/15/2024)   PRAPARE - Administrator, Civil Service (Medical): No    Lack of Transportation (Non-Medical): No  Physical Activity: Inactive (10/15/2024)   Exercise Vital Sign    Days of Exercise per Week: 0 days    Minutes of Exercise per Session: 0 min  Stress: No Stress Concern Present (10/15/2024)   [employer] of Occupational Health - Occupational Stress Questionnaire    Feeling of Stress: Not at all  Social Connections: Socially Isolated (10/15/2024)   Social Connection and Isolation Panel    Frequency of Communication with Friends and Family: More than three times a week    Frequency of Social Gatherings with Friends and Family: More than three times a week    Attends Religious Services: Never    Database Administrator or Organizations: No    Attends Banker Meetings: Never    Marital Status: Never married  Intimate Partner Violence: Not At Risk (10/15/2024)   Humiliation, Afraid, Rape, and Kick questionnaire    Fear of Current or Ex-Partner: No    Emotionally Abused: No    Physically Abused: No    Sexually Abused: No     Current Outpatient Medications:    albuterol  (VENTOLIN  HFA) 108 (90 Base) MCG/ACT inhaler, Inhale 2 puffs into the lungs every 4 (four) hours as needed for wheezing or shortness of breath., Disp: 18 g, Rfl: 2   cinacalcet (SENSIPAR) 90 MG tablet, Take 90 mg by mouth  daily., Disp: , Rfl:    gentamicin cream (GARAMYCIN) 0.1 %, Apply 1 Application topically daily., Disp: , Rfl:    ondansetron  (ZOFRAN -ODT) 4 MG disintegrating tablet, Take 1 tablet (4 mg total) by mouth every 8 (eight) hours as needed for nausea or vomiting., Disp: 20 tablet, Rfl: 0   amoxicillin -clavulanate (AUGMENTIN) 250-125 MG tablet, Take 1 tablet by mouth daily. (Patient not taking: Reported on 10/15/2024), Disp: 7 tablet, Rfl: 0  Allergies  Allergen Reactions   Fruit Extracts Anaphylaxis  Any fruit   Other     fruit Other reaction(s): Other (See Comments) Angioedema; undiagnosed as to what.     ROS  Ten systems reviewed and is negative except as mentioned in HPI    Objective  Vitals:   10/15/24 1518  BP: 100/62  Pulse: 81  Resp: 16  SpO2: 100%  Weight: 99 lb 11.2 oz (45.2 kg)  Height: 5' 1 (1.549 m)    Body mass index is 18.84 kg/m.  Physical Exam  Constitutional: Patient appears well-developed and well-nourished. No distress.  HENT: Head: Normocephalic and atraumatic. Ears: B TMs ok, no erythema or effusion; Nose: Nose normal. Mouth/Throat: Oropharynx is clear and moist. No oropharyngeal exudate.  Eyes: Conjunctivae and EOM are normal. Pupils are equal, round, and reactive to light. No scleral icterus.  Neck: Normal range of motion. Neck supple. No JVD present. No thyromegaly present.  Cardiovascular: Normal rate, regular rhythm and normal heart sounds.  No murmur heard. No BLE edema. Pulmonary/Chest: Effort normal and breath sounds normal. Port present on  No respiratory distress. Abdominal: Soft.  Dyalsis catheter on right upper quadrant, multiple abdominal scars noticed  There is no tenderness.  Breast: no lumps or masses, no nipple discharge or rashes FEMALE GENITALIA:  refused RECTAL: refused  Musculoskeletal: Normal range of motion, no joint effusions. No gross deformities Neurological: he is alert and oriented to person, place, and time. No cranial  nerve deficit. Coordination, balance, strength, speech and gait are normal.  Skin: Skin is warm and dry. No rash noted. No erythema.  Psychiatric: Patient has a normal mood and affect. behavior is normal. Judgment and thought content normal.     Assessment & Plan  1. Well woman exam (Primary)  - Lipid panel - Cervicovaginal ancillary only - RPR - HIV Antibody (routine testing w rflx) - Hemoglobin A1c  2. Lipid screening  - Lipid panel  3. Diabetes mellitus screening  - Hemoglobin A1c  4. Routine screening for STI (sexually transmitted infection)  - Cervicovaginal ancillary only - RPR - HIV Antibody (routine testing w rflx)  5. Chronic kidney disease, stage 5 (HCC)  - Microalbumin / creatinine urine ratio  - CMP - CBC - Parathyroid function test   -USPSTF grade A and B recommendations reviewed with patient; age-appropriate recommendations, preventive care, screening tests, etc discussed and encouraged; healthy living encouraged; see AVS for patient education given to patient -Discussed importance of 150 minutes of physical activity weekly, eat two servings of fish weekly, eat one serving of tree nuts ( cashews, pistachios, pecans, almonds.SABRA) every other day, eat 6 servings of fruit/vegetables daily and drink plenty of water and avoid sweet beverages.   -Reviewed Health Maintenance: Yes.

## 2024-10-15 NOTE — Patient Instructions (Signed)

## 2024-10-17 ENCOUNTER — Ambulatory Visit: Payer: Self-pay | Admitting: Family Medicine

## 2024-10-17 ENCOUNTER — Other Ambulatory Visit: Payer: Self-pay | Admitting: Family Medicine

## 2024-10-17 DIAGNOSIS — B9689 Other specified bacterial agents as the cause of diseases classified elsewhere: Secondary | ICD-10-CM

## 2024-10-17 LAB — LIPID PANEL
Cholesterol: 154 mg/dL (ref <200)
HDL: 36 mg/dL — ABNORMAL LOW (ref >=50)
LDL Cholesterol (Calc): 91 mg/dL
Non-HDL Cholesterol (Calc): 118 mg/dL (ref <130)
Total CHOL/HDL Ratio: 4.3 (calc) (ref <5.0)
Triglycerides: 167 mg/dL — ABNORMAL HIGH (ref <150)

## 2024-10-17 LAB — HIV ANTIBODY (ROUTINE TESTING W REFLEX)
HIV 1&2 Ab, 4th Generation: NONREACTIVE
HIV FINAL INTERPRETATION: NEGATIVE

## 2024-10-17 LAB — COMPREHENSIVE METABOLIC PANEL WITH GFR
AG Ratio: 1.8 (calc) (ref 1.0–2.5)
ALT: 4 U/L — ABNORMAL LOW (ref 6–29)
AST: 10 U/L (ref 10–30)
Albumin: 4.5 g/dL (ref 3.6–5.1)
Alkaline phosphatase (APISO): 377 U/L — ABNORMAL HIGH (ref 31–125)
BUN/Creatinine Ratio: 6 (calc) (ref 6–22)
BUN: 45 mg/dL — ABNORMAL HIGH (ref 7–25)
CO2: 23 mmol/L (ref 20–32)
Calcium: 7.6 mg/dL — ABNORMAL LOW (ref 8.6–10.2)
Chloride: 107 mmol/L (ref 98–110)
Creat: 7.13 mg/dL — ABNORMAL HIGH (ref 0.50–0.96)
Globulin: 2.5 g/dL (ref 1.9–3.7)
Glucose, Bld: 84 mg/dL (ref 65–99)
Potassium: 5.7 mmol/L — ABNORMAL HIGH (ref 3.5–5.3)
Sodium: 140 mmol/L (ref 135–146)
Total Bilirubin: 0.7 mg/dL (ref 0.2–1.2)
Total Protein: 7 g/dL (ref 6.1–8.1)
eGFR: 8 mL/min/1.73m2 — ABNORMAL LOW (ref >=60)

## 2024-10-17 LAB — CBC WITH DIFFERENTIAL/PLATELET
Absolute Lymphocytes: 3094 {cells}/uL (ref 850–3900)
Absolute Monocytes: 578 {cells}/uL (ref 200–950)
Basophils Absolute: 43 {cells}/uL (ref 0–200)
Basophils Relative: 0.5 %
Eosinophils Absolute: 221 {cells}/uL (ref 15–500)
Eosinophils Relative: 2.6 %
HCT: 33.9 % — ABNORMAL LOW (ref 35.0–45.0)
Hemoglobin: 11.1 g/dL — ABNORMAL LOW (ref 11.7–15.5)
MCH: 29.1 pg (ref 27.0–33.0)
MCHC: 32.7 g/dL (ref 32.0–36.0)
MCV: 89 fL (ref 80.0–100.0)
MPV: 11.3 fL (ref 7.5–12.5)
Monocytes Relative: 6.8 %
Neutro Abs: 4565 {cells}/uL (ref 1500–7800)
Neutrophils Relative %: 53.7 %
Platelets: 266 Thousand/uL (ref 140–400)
RBC: 3.81 Million/uL (ref 3.80–5.10)
RDW: 13.5 % (ref 11.0–15.0)
Total Lymphocyte: 36.4 %
WBC: 8.5 Thousand/uL (ref 3.8–10.8)

## 2024-10-17 LAB — CERVICOVAGINAL ANCILLARY ONLY
Bacterial Vaginitis (gardnerella): POSITIVE — AB
Candida Glabrata: NEGATIVE
Candida Vaginitis: NEGATIVE
Chlamydia: NEGATIVE
Comment: NEGATIVE
Comment: NEGATIVE
Comment: NEGATIVE
Comment: NEGATIVE
Comment: NEGATIVE
Comment: NORMAL
Neisseria Gonorrhea: NEGATIVE
Trichomonas: NEGATIVE

## 2024-10-17 LAB — HEMOGLOBIN A1C
Hgb A1c MFr Bld: 4.6 % (ref <5.7)
Mean Plasma Glucose: 85 mg/dL
eAG (mmol/L): 4.7 mmol/L

## 2024-10-17 LAB — PTH, INTACT AND CALCIUM
Calcium: 7.6 mg/dL — ABNORMAL LOW (ref 8.6–10.2)
PTH: 1948 pg/mL — ABNORMAL HIGH (ref 16–77)

## 2024-10-17 LAB — MICROALBUMIN / CREATININE URINE RATIO
Creatinine, Urine: 45 mg/dL (ref 20–275)
Microalb Creat Ratio: 827 mg/g{creat} — ABNORMAL HIGH (ref <30)
Microalb, Ur: 37.2 mg/dL

## 2024-10-17 LAB — RPR: RPR Ser Ql: NONREACTIVE

## 2024-10-17 LAB — VITAMIN D 25 HYDROXY (VIT D DEFICIENCY, FRACTURES): Vit D, 25-Hydroxy: 9 ng/mL — ABNORMAL LOW (ref 30–100)

## 2024-10-17 MED ORDER — METRONIDAZOLE 250 MG PO TABS: 250.0000 mg | ORAL_TABLET | Freq: Two times a day (BID) | ORAL | 0 refills | Status: AC

## 2024-10-18 ENCOUNTER — Ambulatory Visit

## 2024-11-16 ENCOUNTER — Ambulatory Visit: Admitting: Family Medicine

## 2025-01-09 ENCOUNTER — Ambulatory Visit

## 2025-01-09 ENCOUNTER — Other Ambulatory Visit: Payer: Self-pay

## 2025-01-09 ENCOUNTER — Ambulatory Visit (INDEPENDENT_AMBULATORY_CARE_PROVIDER_SITE_OTHER): Admitting: Family Medicine

## 2025-01-09 ENCOUNTER — Encounter: Payer: Self-pay | Admitting: Family Medicine

## 2025-01-09 VITALS — BP 110/70 | HR 83 | Temp 98.4°F | Resp 18 | Ht 61.0 in | Wt 100.6 lb

## 2025-01-09 DIAGNOSIS — Z111 Encounter for screening for respiratory tuberculosis: Secondary | ICD-10-CM

## 2025-01-09 DIAGNOSIS — N2581 Secondary hyperparathyroidism of renal origin: Secondary | ICD-10-CM | POA: Diagnosis not present

## 2025-01-09 DIAGNOSIS — D631 Anemia in chronic kidney disease: Secondary | ICD-10-CM

## 2025-01-09 DIAGNOSIS — E786 Lipoprotein deficiency: Secondary | ICD-10-CM | POA: Diagnosis not present

## 2025-01-09 DIAGNOSIS — N185 Chronic kidney disease, stage 5: Secondary | ICD-10-CM | POA: Diagnosis not present

## 2025-01-09 DIAGNOSIS — Z23 Encounter for immunization: Secondary | ICD-10-CM

## 2025-01-09 NOTE — Progress Notes (Signed)
 Name: Ashley Garrison   MRN: [id number]    DOB: [birth year]   Date:01/09/2025       Progress Note  Subjective  Chief Complaint  Chief Complaint  Patient presents with   Medical Management of Chronic Issues   Discussed the use of AI scribe software for clinical note transcription with the patient, who gave verbal consent to proceed.  History of Present Illness Ashley Garrison is a 23 year old female with stage 5 chronic kidney disease who presents for follow-up on her kidney function and management.  She previously underwent peritoneal dialysis. She has not been on dialysis recently and has not followed up with her nephrologist since 2024. Her last nephrology appointment was missed, and she has not been able to reschedule.  Recent lab results from November indicate an estimated glomerular filtration rate (eGFR) of 8 and a creatinine level of 7.13, which is the highest recorded. She was dehydrated at the time of testing. Her potassium level was elevated at 6.5, and she has secondary hyperparathyroidism with a parathyroid hormone level of 1948 and low calcium. Alkaline phosphatase was also elevated. Despite these findings, she has no symptoms such as nausea or fatigue and maintains her daily activities, including work.  She has microalbuminuria with a protein level of 827 in her urine. Her lipid panel showed an improvement in HDL from 16 to 36, slightly elevated triglycerides, and an LDL of 91. She does not have diabetes, and tests for HIV and syphilis were negative. She has mild anemia with a hemoglobin level of 11.1.  She received a flu shot today and has not had a pneumonia shot. She needs  TB screening for work purposes.    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  Social History   Tobacco Use   Smoking status: Never   Smokeless tobacco: Never  Substance Use Topics   Alcohol use: No    Current Medications[1]  Allergies[2]  I personally reviewed active problem list, medication list, allergies with the patient/caregiver today.   ROS  Ten systems reviewed and is negative except as mentioned in HPI    Objective Physical Exam CONSTITUTIONAL: Patient appears well-developed and well-nourished. No distress. HEENT: Head atraumatic, normocephalic, neck supple. CARDIOVASCULAR: Normal rate, regular rhythm and normal heart sounds. No murmur heard. No BLE edema. PULMONARY: Effort normal and breath sounds normal. Lungs clear to auscultation. No respiratory distress. ABDOMINAL: There is no tenderness or distention. Scars present  MUSCULOSKELETAL: Normal gait. Without gross motor or sensory deficit. PSYCHIATRIC: Patient has a normal mood and affect. Behavior is normal. Judgment and thought content normal.  Vitals:   01/09/25 1434  BP: 110/70  Pulse: 83  Resp: 18  Temp: 98.4 F (36.9 C)  TempSrc: Oral  SpO2: 97%  Weight: 100 lb 9.6 oz (45.6 kg)  Height: 5' 1 (1.549 m)    Body mass index is 19.01 kg/m.  Recent Results (from the past 2160 hours)  Cervicovaginal ancillary only  Status: Abnormal   Collection Time: 10/15/24  3:31 PM  Result Value Ref Range   Neisseria Gonorrhea Negative    Chlamydia Negative    Trichomonas Negative    Bacterial Vaginitis (gardnerella) Positive (A)    Candida Vaginitis Negative    Candida Glabrata Negative    Comment      Normal Reference Range Bacterial Vaginosis - Negative   Comment Normal Reference Range  Candida Species - Negative    Comment Normal Reference Range Candida Galbrata - Negative    Comment Normal Reference Range Trichomonas - Negative    Comment Normal Reference Ranger Chlamydia - Negative    Comment      Normal Reference Range Neisseria Gonorrhea - Negative  CBC with Differential/Platelet     Status: Abnormal   Collection Time: 10/15/24  4:07 PM  Result Value Ref Range   WBC 8.5 3.8 - 10.8 Thousand/uL   RBC 3.81 3.80 - 5.10 Million/uL   Hemoglobin 11.1 (L) 11.7 - 15.5 g/dL   HCT 66.0 (L) 64.9 - 54.9 %   MCV 89.0 80.0 - 100.0 fL   MCH 29.1 27.0 - 33.0 pg   MCHC 32.7 32.0 - 36.0 g/dL    Comment: For adults, a slight decrease in the calculated MCHC value (in the range of 30 to 32 g/dL) is most likely not clinically significant; however, it should be interpreted with caution in correlation with other red cell parameters and the patient's clinical condition.    RDW 13.5 11.0 - 15.0 %   Platelets 266 140 - 400 Thousand/uL   MPV 11.3 7.5 - 12.5 fL   Neutro Abs 4,565 1,500 - 7,800 cells/uL   Absolute Lymphocytes 3,094 850 - 3,900 cells/uL   Absolute Monocytes 578 200 - 950 cells/uL   Eosinophils Absolute 221 15 - 500 cells/uL   Basophils Absolute 43 0 - 200 cells/uL   Neutrophils Relative % 53.7 %   Total Lymphocyte 36.4 %   Monocytes Relative 6.8 %   Eosinophils Relative 2.6 %   Basophils Relative 0.5 %  Comprehensive metabolic panel with GFR     Status: Abnormal   Collection Time: 10/15/24  4:07 PM  Result Value Ref Range   Glucose, Bld 84 65 - 99 mg/dL    Comment: .            Fasting reference interval .    BUN 45 (H) 7 - 25 mg/dL   Creat 2.86 (H) 9.49 - 0.96 mg/dL   eGFR 8 (L) > OR = 60 mL/min/1.57m2   BUN/Creatinine Ratio 6 6 - 22 (calc)   Sodium 140 135 - 146 mmol/L   Potassium 5.7 (H) 3.5 - 5.3 mmol/L   Chloride 107 98 - 110 mmol/L   CO2 23 20 - 32 mmol/L   Calcium 7.6 (L) 8.6 - 10.2 mg/dL   Total Protein 7.0 6.1 - 8.1 g/dL   Albumin 4.5 3.6 - 5.1  g/dL   Globulin 2.5 1.9 - 3.7 g/dL (calc)   AG Ratio 1.8 1.0 - 2.5 (calc)   Total Bilirubin 0.7 0.2 - 1.2 mg/dL   Alkaline phosphatase (APISO) 377 (H) 31 - 125 U/L   AST 10 10 - 30 U/L   ALT 4 (L) 6 - 29 U/L  VITAMIN D  25 Hydroxy (Vit-D Deficiency, Fractures)     Status: Abnormal   Collection Time: 10/15/24  4:07 PM  Result Value Ref Range   Vit D, 25-Hydroxy 9 (L) 30 - 100 ng/mL  Comment: Vitamin D  Status         25-OH Vitamin D : . Deficiency:                    <20 ng/mL Insufficiency:             20 - 29 ng/mL Optimal:                 > or = 30 ng/mL . For 25-OH Vitamin D  testing on patients on  D2-supplementation and patients for whom quantitation  of D2 and D3 fractions is required, the Quest[facility]ureD(TM) 25-OH VIT D, (D2,D3), LC/MS/MS is recommended: order  code 07111 (patients >53yrs). . See Note 1 . Note 1 . For additional information, please refer to  http://education.[facility].com/faq/FAQ199  (This link is being provided for informational/ educational purposes only.)   PTH, intact and calcium     Status: Abnormal   Collection Time: 10/15/24  4:07 PM  Result Value Ref Range   PTH 1,948 (H) 16 - 77 pg/mL    Comment: . Interpretive Guide    Intact PTH           Calcium ------------------    ----------           ------- Normal Parathyroid    Normal               Normal Hypoparathyroidism    Low or Low Normal    Low Hyperparathyroidism    Primary            Normal or High       High    Secondary          High                 Normal or Low    Tertiary           High                 High Non-Parathyroid    Hypercalcemia      Low or Low Normal    High .    Calcium 7.6 (L) 8.6 - 10.2 mg/dL  Microalbumin / creatinine urine ratio     Status: Abnormal   Collection Time: 10/15/24  4:07 PM  Result Value Ref Range   Creatinine, Urine 45 20 - 275 mg/dL   Microalb, Ur 62.7 mg/dL    Comment: Verified by repeat analysis. SABRA Reference Range Not established     Microalb Creat Ratio 827 (H) <30 mg/g creat    Comment: . The ADA defines abnormalities in albumin excretion as follows: SABRA Albuminuria Category        Result (mg/g creatinine) . Normal to Mildly increased   <30 Moderately increased         30-299  Severely increased           > OR = 300 . The ADA recommends that at least two of three specimens collected within a 3-6 month period be abnormal before considering a patient to be within a diagnostic category.   Lipid panel     Status: Abnormal   Collection Time: 10/15/24  4:07 PM  Result Value Ref Range   Cholesterol 154 <200 mg/dL   HDL 36 (L) > OR = 50 mg/dL   Triglycerides 832 (H) <150 mg/dL   LDL Cholesterol (Calc) 91 mg/dL (calc)    Comment: Reference range: <100 . Desirable range <100 mg/dL for primary prevention;   <70 mg/dL for  patients with CHD or diabetic patients  with > or = 2 CHD risk factors. SABRA LDL-C is now calculated using the Martin-Hopkins  calculation, which is a validated novel method providing  better accuracy than the Friedewald equation in the  estimation of LDL-C.  Gladis APPLETHWAITE et al. SANDREA. 7986;689(80): 2061-2068  (http://education.[facility].com/faq/FAQ164)    Total CHOL/HDL Ratio 4.3 <5.0 (calc)   Non-HDL Cholesterol (Calc) 118 <130 mg/dL (calc)    Comment: For patients with diabetes plus 1 major ASCVD risk  factor, treating to a non-HDL-C goal of <100 mg/dL  (LDL-C of <29 mg/dL) is considered a therapeutic  option.   Hemoglobin A1c     Status: None   Collection Time: 10/15/24  4:07 PM  Result Value Ref Range   Hgb A1c MFr Bld 4.6 <5.7 %    Comment: For the purpose of screening for the presence of diabetes: . <5.7%       Consistent with the absence of diabetes 5.7-6.4%    Consistent with increased risk for diabetes             (prediabetes) > or =6.5%  Consistent with diabetes . This assay result is consistent with a decreased risk of diabetes. . Currently, no consensus exists  regarding use of hemoglobin A1c for diagnosis of diabetes in children. . According to American Diabetes Association (ADA) guidelines, hemoglobin A1c <7.0% represents optimal control in non-pregnant diabetic patients. Different metrics may apply to specific patient populations.  Standards of Medical Care in Diabetes(ADA). .    Mean Plasma Glucose 85 mg/dL   eAG (mmol/L) 4.7 mmol/L  HIV Antibody (routine testing w rflx)     Status: None   Collection Time: 10/15/24  4:07 PM  Result Value Ref Range   HIV FINAL INTERPRETATION HIV NEGATIVE     Comment: HIV-1 antigen and HIV-1/HIV-2 antibodies were not detected. There is no laboratory evidence of HIV infection.    HIV 1&2 Ab, 4th Generation NON-REACTIVE NON-REACTIVE  RPR     Status: None   Collection Time: 10/15/24  4:07 PM  Result Value Ref Range   RPR Ser Ql NON-REACTIVE NON-REACTIVE    Comment: . No laboratory evidence of syphilis. If recent exposure is suspected, submit a new sample in 2-4 weeks. .     PHQ2/9:    01/09/2025    2:35 PM 10/15/2024    3:09 PM 09/18/2024    1:28 PM 08/29/2023    1:40 PM 06/28/2023    1:35 PM  Depression screen PHQ 2/9  Decreased Interest 0 0 0 0 0  Down, Depressed, Hopeless 0 0 0 0 0  PHQ - 2 Score 0 0 0 0 0  Altered sleeping    0 0  Tired, decreased energy    0 0  Change in appetite    0 0  Feeling bad or failure about yourself     0 0  Trouble concentrating    0 0  Moving slowly or fidgety/restless    0 0  Suicidal thoughts    0 0  PHQ-9 Score    0  0   Difficult doing work/chores    Not difficult at all Not difficult at all     Data saved with a previous flowsheet row definition    phq 9 is negative  Fall Risk:    01/09/2025    2:35 PM 10/15/2024    3:09 PM 09/18/2024    1:28 PM 08/29/2023    1:40 PM 06/28/2023  1:35 PM  Fall Risk   Falls in the past year? 0 0 0 0 0  Number falls in past yr: 0 0 0 0 0  Injury with Fall? 0 0  0  0  0   Risk for fall due to : No Fall Risks No  Fall Risks No Fall Risks No Fall Risks No Fall Risks  Follow up Falls evaluation completed Falls evaluation completed Falls evaluation completed Falls prevention discussed;Education provided;Falls evaluation completed Falls prevention discussed;Education provided;Falls evaluation completed     Data saved with a previous flowsheet row definition      Assessment & Plan Chronic kidney disease, stage 5 Stage 5 CKD with eGFR 8, creatinine 7.13, severe renal impairment. Not on dialysis. Microalbuminuria with proteinuria 827. Elevated potassium. No nephrology follow-up since 2023. - Referred to nephrologist Dr. Lazarus for management. - Ordered CMP to monitor kidney function and alkaline phosphatase.  Anemia in stage 5 chronic kidney disease Mild anemia with hemoglobin 11.1, likely secondary to CKD.  Secondary hyperparathyroidism of renal origin Secondary hyperparathyroidism with PTH 1948, low calcium, significant parathyroid dysfunction due to renal origin.  Low HDL cholesterol HDL improved from 16 to 36. Triglycerides slightly elevated, possibly non-fasting. LDL 91, acceptable.  General health maintenance Flu shot given. Pap smear due. - Schedule Pap smear at next visit. - Encouraged consideration of pneumonia vaccination.        [1]  Current Outpatient Medications:    albuterol  (VENTOLIN  HFA) 108 (90 Base) MCG/ACT inhaler, Inhale 2 puffs into the lungs every 4 (four) hours as needed for wheezing or shortness of breath., Disp: 18 g, Rfl: 2   cinacalcet (SENSIPAR) 90 MG tablet, Take 90 mg by mouth daily., Disp: , Rfl:    gentamicin cream (GARAMYCIN) 0.1 %, Apply 1 Application topically daily., Disp: , Rfl:    ondansetron  (ZOFRAN -ODT) 4 MG disintegrating tablet, Take 1 tablet (4 mg total) by mouth every 8 (eight) hours as needed for nausea or vomiting., Disp: 20 tablet, Rfl: 0 [2]  Allergies Allergen Reactions   Fruit Extracts Anaphylaxis    Any fruit   Other     fruit Other  reaction(s): Other (See Comments) Angioedema; undiagnosed as to what.

## 2025-01-10 ENCOUNTER — Ambulatory Visit: Payer: Self-pay | Admitting: Family Medicine

## 2025-01-10 ENCOUNTER — Telehealth: Payer: Self-pay

## 2025-01-10 DIAGNOSIS — R748 Abnormal levels of other serum enzymes: Secondary | ICD-10-CM

## 2025-01-10 NOTE — Telephone Encounter (Signed)
 Pt mom aware, then called pt no answer left detailed vm but also sent MyChart message

## 2025-01-10 NOTE — Telephone Encounter (Signed)
 Copied from CRM #[id number]. Topic: General - Other >> Jan 09, 2025  4:27 PM Fonda T wrote: Reason for CRM: Received call from [facility] with Dr. Gaylon, calling to inform PCP, pt has been scheduled for an appt on 01/14/25 at 1:40 pm.  If need to speak further canb e reached at [phone]

## 2025-01-15 LAB — QUANTIFERON-TB GOLD PLUS
Mitogen-NIL: 7.47 [IU]/mL
NIL: 0.02 [IU]/mL
QuantiFERON-TB Gold Plus: NEGATIVE
TB1-NIL: 0 [IU]/mL
TB2-NIL: 0 [IU]/mL

## 2025-01-15 LAB — HEPATIC FUNCTION PANEL
AG Ratio: 1.5 (calc) (ref 1.0–2.5)
ALT: 4 U/L — ABNORMAL LOW (ref 6–29)
AST: 9 U/L — ABNORMAL LOW (ref 10–30)
Albumin: 4.2 g/dL (ref 3.6–5.1)
Alkaline phosphatase (APISO): 425 U/L — ABNORMAL HIGH (ref 31–125)
Globulin: 2.8 g/dL (ref 1.9–3.7)
Total Bilirubin: 0.5 mg/dL (ref 0.2–1.2)
Total Protein: 7 g/dL (ref 6.1–8.1)

## 2025-01-15 LAB — COMPREHENSIVE METABOLIC PANEL WITH GFR
AG Ratio: 1.5 (calc) (ref 1.0–2.5)
ALT: 4 U/L — ABNORMAL LOW (ref 6–29)
AST: 9 U/L — ABNORMAL LOW (ref 10–30)
Albumin: 4.2 g/dL (ref 3.6–5.1)
Alkaline phosphatase (APISO): 425 U/L — ABNORMAL HIGH (ref 31–125)
BUN/Creatinine Ratio: 5 (calc) — ABNORMAL LOW (ref 6–22)
BUN: 43 mg/dL — ABNORMAL HIGH (ref 7–25)
CO2: 22 mmol/L (ref 20–32)
Calcium: 7.3 mg/dL — ABNORMAL LOW (ref 8.6–10.2)
Chloride: 106 mmol/L (ref 98–110)
Creat: 7.97 mg/dL — ABNORMAL HIGH (ref 0.50–0.96)
Globulin: 2.8 g/dL (ref 1.9–3.7)
Glucose, Bld: 66 mg/dL (ref 65–99)
Potassium: 3.8 mmol/L (ref 3.5–5.3)
Sodium: 139 mmol/L (ref 135–146)
Total Bilirubin: 0.5 mg/dL (ref 0.2–1.2)
Total Protein: 7 g/dL (ref 6.1–8.1)
eGFR: 7 mL/min/{1.73_m2} — ABNORMAL LOW

## 2025-01-15 LAB — GAMMA GT: GGT: 8 U/L (ref 3–40)

## 2025-07-19 ENCOUNTER — Ambulatory Visit: Admitting: Family Medicine

## 2025-10-18 ENCOUNTER — Encounter: Admitting: Family Medicine
